# Patient Record
Sex: Male | Born: 1950 | Race: White | Hispanic: No | Marital: Married | Smoking: Former smoker
Health system: Southern US, Community
[De-identification: ages and names within clinical notes are randomized; demographics above are authoritative.]

## PROBLEM LIST (undated history)

## (undated) DIAGNOSIS — I4510 Unspecified right bundle-branch block: Secondary | ICD-10-CM

## (undated) DIAGNOSIS — E785 Hyperlipidemia, unspecified: Secondary | ICD-10-CM

## (undated) DIAGNOSIS — F1010 Alcohol abuse, uncomplicated: Secondary | ICD-10-CM

## (undated) DIAGNOSIS — I471 Supraventricular tachycardia, unspecified: Secondary | ICD-10-CM

## (undated) DIAGNOSIS — Z860100 Personal history of colon polyps, unspecified: Secondary | ICD-10-CM

## (undated) DIAGNOSIS — Z52000 Unspecified donor, whole blood: Secondary | ICD-10-CM

## (undated) DIAGNOSIS — L409 Psoriasis, unspecified: Secondary | ICD-10-CM

## (undated) DIAGNOSIS — I10 Essential (primary) hypertension: Secondary | ICD-10-CM

## (undated) HISTORY — DX: Essential (primary) hypertension: I10

## (undated) HISTORY — DX: Unspecified right bundle-branch block: I45.10

## (undated) HISTORY — DX: Unspecified donor, whole blood: Z52.000

## (undated) HISTORY — DX: Personal history of colonic polyps: Z86.010

## (undated) HISTORY — DX: Personal history of colon polyps, unspecified: Z86.0100

## (undated) HISTORY — DX: Supraventricular tachycardia, unspecified: I47.10

## (undated) HISTORY — DX: Alcohol abuse, uncomplicated: F10.10

## (undated) HISTORY — DX: Hyperlipidemia, unspecified: E78.5

## (undated) HISTORY — DX: Supraventricular tachycardia: I47.1

---
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
## 1970-03-14 HISTORY — PX: OTHER SURGICAL HISTORY: SHX169

## 1997-03-14 HISTORY — PX: KNEE SURGERY: SHX244

## 1998-08-14 ENCOUNTER — Ambulatory Visit (HOSPITAL_COMMUNITY): Admission: RE | Admit: 1998-08-14 | Discharge: 1998-08-14 | Payer: Self-pay | Admitting: Gastroenterology

## 1999-11-09 ENCOUNTER — Ambulatory Visit (HOSPITAL_COMMUNITY): Admission: RE | Admit: 1999-11-09 | Discharge: 1999-11-09 | Payer: Self-pay | Admitting: Specialist

## 1999-11-09 ENCOUNTER — Encounter: Payer: Self-pay | Admitting: Family Medicine

## 2001-11-26 ENCOUNTER — Ambulatory Visit (HOSPITAL_BASED_OUTPATIENT_CLINIC_OR_DEPARTMENT_OTHER): Admission: RE | Admit: 2001-11-26 | Discharge: 2001-11-26 | Payer: Self-pay | Admitting: Specialist

## 2004-01-09 ENCOUNTER — Ambulatory Visit: Payer: Self-pay | Admitting: Pulmonary Disease

## 2004-01-16 ENCOUNTER — Ambulatory Visit: Payer: Self-pay | Admitting: Pulmonary Disease

## 2004-01-22 ENCOUNTER — Ambulatory Visit: Payer: Self-pay | Admitting: Pulmonary Disease

## 2004-02-18 ENCOUNTER — Ambulatory Visit: Payer: Self-pay | Admitting: Pulmonary Disease

## 2004-03-02 ENCOUNTER — Ambulatory Visit: Payer: Self-pay | Admitting: Pulmonary Disease

## 2005-01-14 ENCOUNTER — Ambulatory Visit: Payer: Self-pay | Admitting: Pulmonary Disease

## 2005-01-24 ENCOUNTER — Ambulatory Visit: Payer: Self-pay | Admitting: Pulmonary Disease

## 2005-04-21 ENCOUNTER — Ambulatory Visit: Payer: Self-pay | Admitting: Pulmonary Disease

## 2005-05-09 ENCOUNTER — Ambulatory Visit: Payer: Self-pay | Admitting: Internal Medicine

## 2006-01-13 ENCOUNTER — Ambulatory Visit: Payer: Self-pay | Admitting: Pulmonary Disease

## 2006-01-23 ENCOUNTER — Ambulatory Visit: Payer: Self-pay | Admitting: Pulmonary Disease

## 2006-01-23 LAB — CONVERTED CEMR LAB
Fecal Occult Blood: NEGATIVE
OCCULT 1: NEGATIVE
OCCULT 2: NEGATIVE
OCCULT 3: NEGATIVE
OCCULT 4: NEGATIVE
OCCULT 5: NEGATIVE

## 2007-01-15 ENCOUNTER — Ambulatory Visit: Payer: Self-pay | Admitting: Pulmonary Disease

## 2007-01-15 LAB — CONVERTED CEMR LAB
ALT: 33 units/L (ref 0–53)
AST: 24 units/L (ref 0–37)
Albumin: 4.2 g/dL (ref 3.5–5.2)
Alkaline Phosphatase: 70 units/L (ref 39–117)
BUN: 6 mg/dL (ref 6–23)
Basophils Absolute: 0 10*3/uL (ref 0.0–0.1)
Basophils Relative: 0.6 % (ref 0.0–1.0)
Bilirubin, Direct: 0.1 mg/dL (ref 0.0–0.3)
CO2: 32 meq/L (ref 19–32)
Calcium: 9.2 mg/dL (ref 8.4–10.5)
Chloride: 102 meq/L (ref 96–112)
Cholesterol: 212 mg/dL (ref 0–200)
Creatinine, Ser: 0.8 mg/dL (ref 0.4–1.5)
Direct LDL: 136.6 mg/dL
Eosinophils Absolute: 0.1 10*3/uL (ref 0.0–0.6)
Eosinophils Relative: 1.1 % (ref 0.0–5.0)
GFR calc Af Amer: 129 mL/min
GFR calc non Af Amer: 106 mL/min
Glucose, Bld: 113 mg/dL — ABNORMAL HIGH (ref 70–99)
HCT: 43.2 % (ref 39.0–52.0)
HDL: 50.2 mg/dL (ref >39.0)
Hemoglobin: 15.2 g/dL (ref 13.0–17.0)
Lymphocytes Relative: 14.9 % (ref 12.0–46.0)
MCHC: 35.2 g/dL (ref 30.0–36.0)
MCV: 92.5 fL (ref 78.0–100.0)
Monocytes Absolute: 0.7 10*3/uL (ref 0.2–0.7)
Monocytes Relative: 9.7 % (ref 3.0–11.0)
Neutro Abs: 5.3 10*3/uL (ref 1.4–7.7)
Neutrophils Relative %: 73.7 % (ref 43.0–77.0)
PSA: 0.6 ng/mL (ref 0.10–4.00)
Platelets: 322 10*3/uL (ref 150–400)
Potassium: 4.8 meq/L (ref 3.5–5.1)
RBC: 4.67 M/uL (ref 4.22–5.81)
RDW: 11.4 % — ABNORMAL LOW (ref 11.5–14.6)
Sodium: 141 meq/L (ref 135–145)
TSH: 0.98 microintl units/mL (ref 0.35–5.50)
Total Bilirubin: 1 mg/dL (ref 0.3–1.2)
Total CHOL/HDL Ratio: 4.2
Total Protein: 7.3 g/dL (ref 6.0–8.3)
Triglycerides: 107 mg/dL (ref 0–149)
VLDL: 21 mg/dL (ref 0–40)
WBC: 7.2 10*3/uL (ref 4.5–10.5)

## 2007-01-17 ENCOUNTER — Ambulatory Visit: Payer: Self-pay | Admitting: Cardiology

## 2007-01-29 ENCOUNTER — Encounter: Payer: Self-pay | Admitting: Internal Medicine

## 2007-01-29 ENCOUNTER — Ambulatory Visit: Payer: Self-pay

## 2007-01-30 ENCOUNTER — Ambulatory Visit: Payer: Self-pay | Admitting: Pulmonary Disease

## 2007-01-30 LAB — CONVERTED CEMR LAB
Fecal Occult Blood: NEGATIVE
OCCULT 1: NEGATIVE
OCCULT 2: NEGATIVE
OCCULT 3: NEGATIVE
OCCULT 4: NEGATIVE
OCCULT 5: NEGATIVE

## 2007-01-31 ENCOUNTER — Ambulatory Visit: Payer: Self-pay | Admitting: Cardiology

## 2007-03-26 ENCOUNTER — Ambulatory Visit: Payer: Self-pay | Admitting: Internal Medicine

## 2007-04-11 ENCOUNTER — Ambulatory Visit: Payer: Self-pay | Admitting: Internal Medicine

## 2007-12-17 ENCOUNTER — Emergency Department (HOSPITAL_COMMUNITY): Admission: EM | Admit: 2007-12-17 | Discharge: 2007-12-17 | Payer: Self-pay | Admitting: Emergency Medicine

## 2008-01-03 ENCOUNTER — Ambulatory Visit: Payer: Self-pay | Admitting: Internal Medicine

## 2008-01-14 ENCOUNTER — Encounter: Payer: Self-pay | Admitting: Pulmonary Disease

## 2008-01-14 DIAGNOSIS — I10 Essential (primary) hypertension: Secondary | ICD-10-CM

## 2008-01-14 DIAGNOSIS — I4510 Unspecified right bundle-branch block: Secondary | ICD-10-CM

## 2008-01-14 DIAGNOSIS — F1010 Alcohol abuse, uncomplicated: Secondary | ICD-10-CM | POA: Insufficient documentation

## 2008-01-14 DIAGNOSIS — E782 Mixed hyperlipidemia: Secondary | ICD-10-CM | POA: Insufficient documentation

## 2008-01-15 ENCOUNTER — Ambulatory Visit: Payer: Self-pay | Admitting: Pulmonary Disease

## 2008-01-15 DIAGNOSIS — K649 Unspecified hemorrhoids: Secondary | ICD-10-CM | POA: Insufficient documentation

## 2008-01-21 LAB — CONVERTED CEMR LAB
ALT: 29 units/L (ref 0–53)
AST: 28 units/L (ref 0–37)
Albumin: 4.5 g/dL (ref 3.5–5.2)
Alkaline Phosphatase: 64 units/L (ref 39–117)
BUN: 9 mg/dL (ref 6–23)
Bacteria, UA: NEGATIVE
Basophils Absolute: 0 10*3/uL (ref 0.0–0.1)
Basophils Relative: 0.5 % (ref 0.0–3.0)
Bilirubin Urine: NEGATIVE
Bilirubin, Direct: 0.2 mg/dL (ref 0.0–0.3)
CO2: 32 meq/L (ref 19–32)
Calcium: 9.2 mg/dL (ref 8.4–10.5)
Chloride: 101 meq/L (ref 96–112)
Cholesterol: 171 mg/dL (ref 0–200)
Creatinine, Ser: 0.7 mg/dL (ref 0.4–1.5)
Crystals: NEGATIVE
Eosinophils Absolute: 0.1 10*3/uL (ref 0.0–0.7)
Eosinophils Relative: 1.8 % (ref 0.0–5.0)
GFR calc Af Amer: 149 mL/min
GFR calc non Af Amer: 124 mL/min
Glucose, Bld: 94 mg/dL (ref 70–99)
HCT: 42.1 % (ref 39.0–52.0)
HDL: 60.6 mg/dL (ref >39.0)
Hemoglobin, Urine: NEGATIVE
Hemoglobin: 15.1 g/dL (ref 13.0–17.0)
Ketones, ur: NEGATIVE mg/dL
LDL Cholesterol: 86 mg/dL (ref 0–99)
Leukocytes, UA: NEGATIVE
Lymphocytes Relative: 19.3 % (ref 12.0–46.0)
MCHC: 35.9 g/dL (ref 30.0–36.0)
MCV: 92.1 fL (ref 78.0–100.0)
Monocytes Absolute: 0.6 10*3/uL (ref 0.1–1.0)
Monocytes Relative: 11.4 % (ref 3.0–12.0)
Neutro Abs: 3.9 10*3/uL (ref 1.4–7.7)
Neutrophils Relative %: 67 % (ref 43.0–77.0)
Nitrite: NEGATIVE
PSA: 0.59 ng/mL (ref 0.10–4.00)
Platelets: 259 10*3/uL (ref 150–400)
Potassium: 4.2 meq/L (ref 3.5–5.1)
RBC: 4.58 M/uL (ref 4.22–5.81)
RDW: 12.2 % (ref 11.5–14.6)
Sodium: 142 meq/L (ref 135–145)
Specific Gravity, Urine: 1.02 (ref 1.000–1.03)
Squamous Epithelial / HPF: NEGATIVE /lpf
TSH: 1.71 microintl units/mL (ref 0.35–5.50)
Total Bilirubin: 1.2 mg/dL (ref 0.3–1.2)
Total CHOL/HDL Ratio: 2.8
Total Protein, Urine: NEGATIVE mg/dL
Total Protein: 7.6 g/dL (ref 6.0–8.3)
Triglycerides: 121 mg/dL (ref 0–149)
Urine Glucose: NEGATIVE mg/dL
Urobilinogen, UA: 0.2 (ref 0.0–1.0)
VLDL: 24 mg/dL (ref 0–40)
WBC, UA: NONE SEEN cells/hpf
WBC: 5.7 10*3/uL (ref 4.5–10.5)
pH: 6.5 (ref 5.0–8.0)

## 2008-01-29 ENCOUNTER — Ambulatory Visit: Payer: Self-pay | Admitting: Pulmonary Disease

## 2008-02-14 LAB — CONVERTED CEMR LAB
Fecal Occult Blood: NEGATIVE
OCCULT 1: NEGATIVE
OCCULT 2: NEGATIVE
OCCULT 3: NEGATIVE
OCCULT 4: NEGATIVE
OCCULT 5: NEGATIVE

## 2008-06-21 DIAGNOSIS — I471 Supraventricular tachycardia: Secondary | ICD-10-CM

## 2008-06-24 ENCOUNTER — Ambulatory Visit: Payer: Self-pay | Admitting: Internal Medicine

## 2008-06-24 ENCOUNTER — Encounter: Payer: Self-pay | Admitting: Internal Medicine

## 2008-08-05 ENCOUNTER — Telehealth: Payer: Self-pay | Admitting: Internal Medicine

## 2008-12-23 ENCOUNTER — Ambulatory Visit: Payer: Self-pay | Admitting: Internal Medicine

## 2009-01-14 ENCOUNTER — Ambulatory Visit: Payer: Self-pay | Admitting: Pulmonary Disease

## 2009-01-15 LAB — CONVERTED CEMR LAB
AST: 27 units/L (ref 0–37)
BUN: 10 mg/dL (ref 6–23)
Basophils Absolute: 0 10*3/uL (ref 0.0–0.1)
Bilirubin, Direct: 0.2 mg/dL (ref 0.0–0.3)
Cholesterol: 168 mg/dL (ref 0–200)
Creatinine, Ser: 0.9 mg/dL (ref 0.4–1.5)
GFR calc non Af Amer: 91.97 mL/min (ref >60)
Glucose, Bld: 93 mg/dL (ref 70–99)
HCT: 42.8 % (ref 39.0–52.0)
HDL: 49.4 mg/dL (ref >39.00)
LDL Cholesterol: 95 mg/dL (ref 0–99)
Leukocytes, UA: NEGATIVE
Lymphs Abs: 1.1 10*3/uL (ref 0.7–4.0)
Monocytes Absolute: 0.6 10*3/uL (ref 0.1–1.0)
Monocytes Relative: 11.1 % (ref 3.0–12.0)
Neutrophils Relative %: 66.8 % (ref 43.0–77.0)
PSA: 0.61 ng/mL (ref 0.10–4.00)
Platelets: 247 10*3/uL (ref 150.0–400.0)
Potassium: 4.3 meq/L (ref 3.5–5.1)
RDW: 11.8 % (ref 11.5–14.6)
Specific Gravity, Urine: 1.02 (ref 1.000–1.030)
TSH: 1.07 microintl units/mL (ref 0.35–5.50)
Total Bilirubin: 1.4 mg/dL — ABNORMAL HIGH (ref 0.3–1.2)
Triglycerides: 117 mg/dL (ref 0.0–149.0)
Urobilinogen, UA: 0.2 (ref 0.0–1.0)
VLDL: 23.4 mg/dL (ref 0.0–40.0)
WBC: 5.7 10*3/uL (ref 4.5–10.5)
pH: 6 (ref 5.0–8.0)

## 2009-01-23 IMAGING — CR DG CHEST 2V
2 series · 2 of 2 positions shown · non-contrast
Comparison: 01/13/06.

CLINICAL DATA: Wellness.
 CHEST - 2 VIEWS:

[view not recorded (1 of 2)]
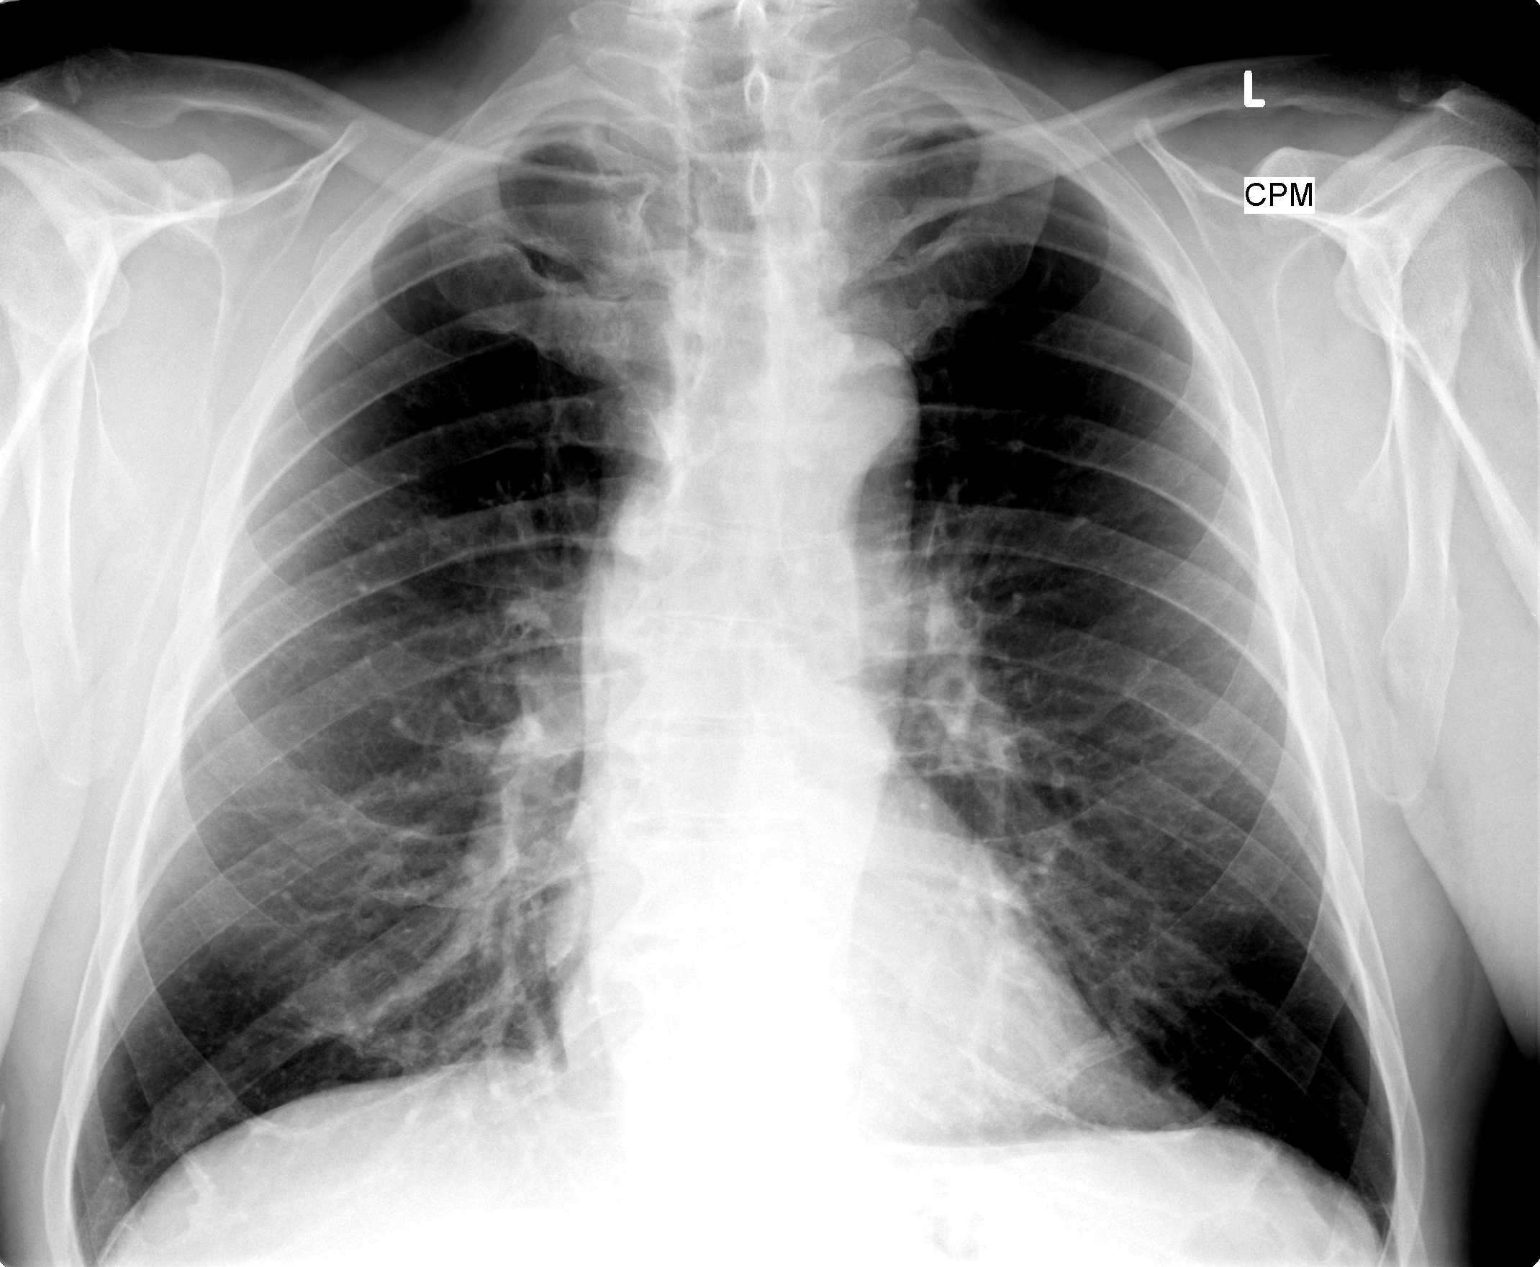

[view not recorded (2 of 2)]
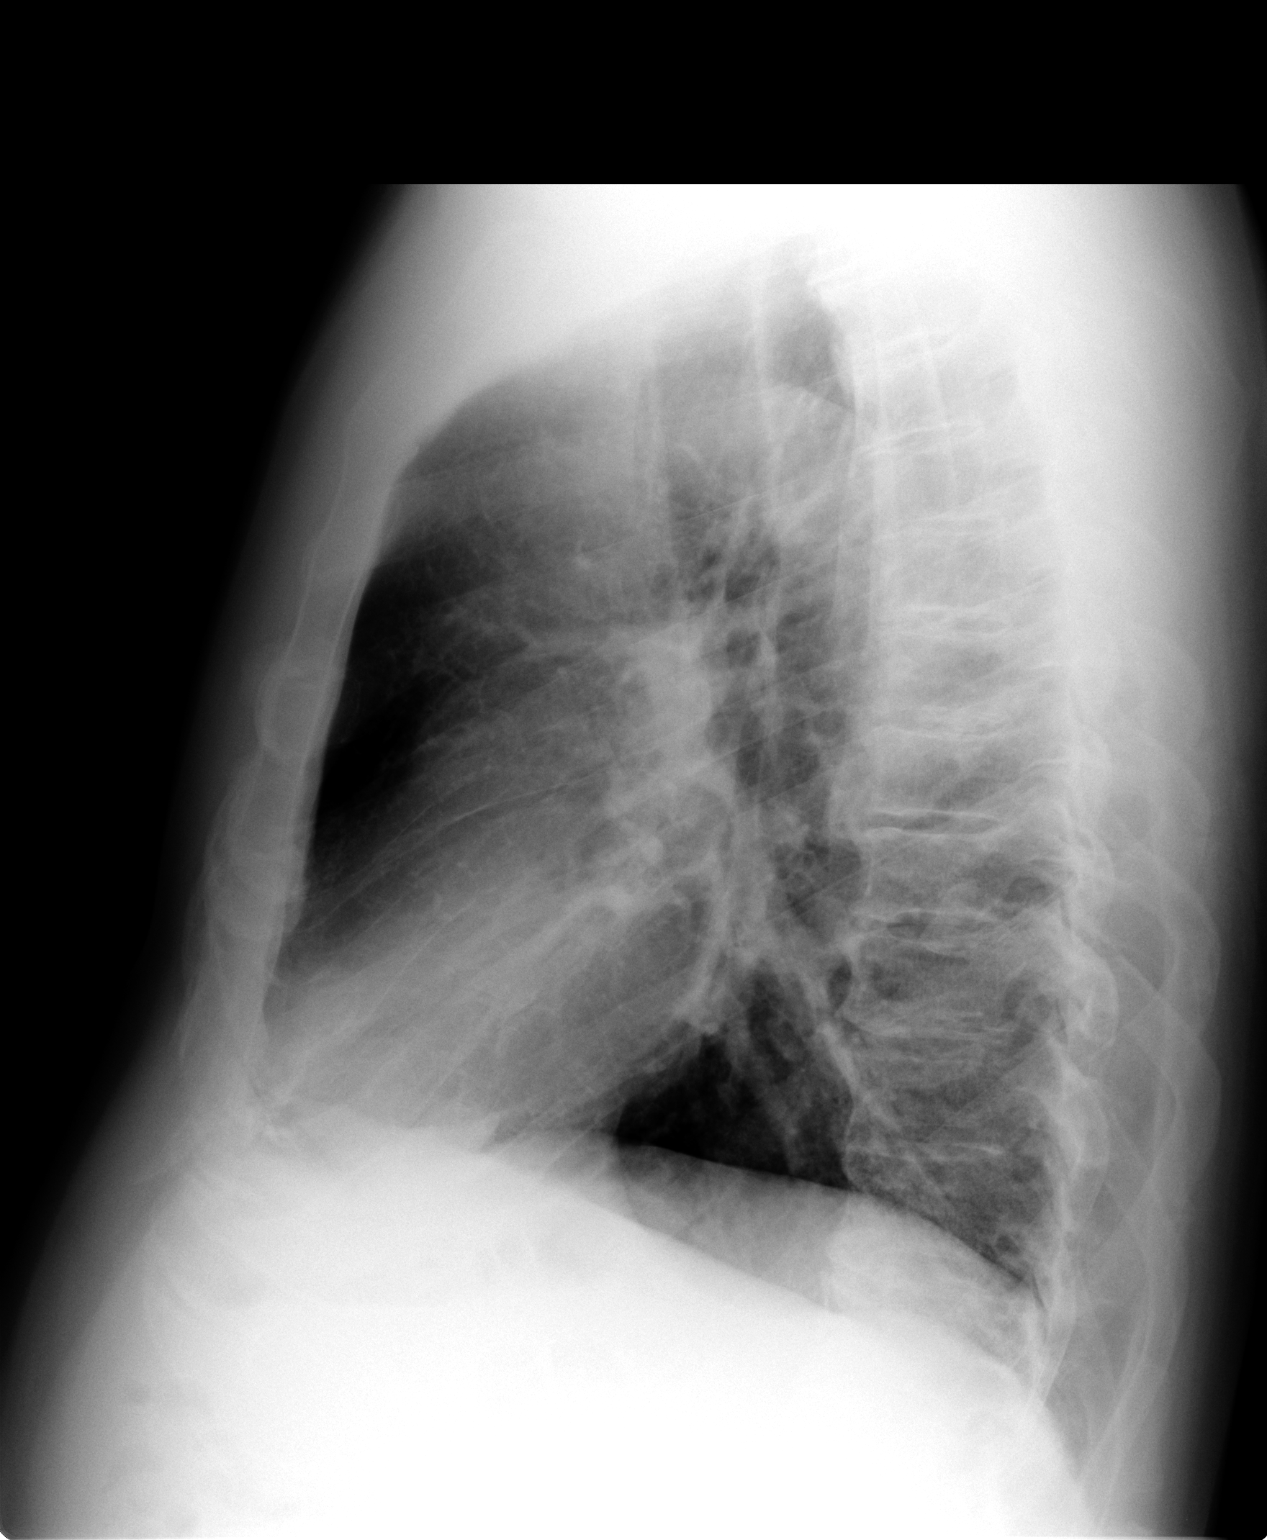

[2 of 2 positions shown; findings below may reference images not displayed]

FINDINGS: Two views of the chest show the lungs to be clear and slightly hyperaerated.  The heart is within normal limits in size.  There are degenerative changes throughout the thoracic spine.
IMPRESSION: Stable chest x-ray.  No active lung disease.

## 2009-02-02 ENCOUNTER — Encounter: Payer: Self-pay | Admitting: Pulmonary Disease

## 2009-05-12 ENCOUNTER — Telehealth (INDEPENDENT_AMBULATORY_CARE_PROVIDER_SITE_OTHER): Payer: Self-pay | Admitting: *Deleted

## 2009-06-23 ENCOUNTER — Telehealth (INDEPENDENT_AMBULATORY_CARE_PROVIDER_SITE_OTHER): Payer: Self-pay | Admitting: *Deleted

## 2009-07-13 ENCOUNTER — Telehealth: Payer: Self-pay | Admitting: Pulmonary Disease

## 2009-08-03 ENCOUNTER — Telehealth: Payer: Self-pay | Admitting: Internal Medicine

## 2009-11-26 ENCOUNTER — Telehealth (INDEPENDENT_AMBULATORY_CARE_PROVIDER_SITE_OTHER): Payer: Self-pay | Admitting: *Deleted

## 2009-12-01 ENCOUNTER — Telehealth: Payer: Self-pay | Admitting: Pulmonary Disease

## 2009-12-22 ENCOUNTER — Ambulatory Visit: Payer: Self-pay | Admitting: Internal Medicine

## 2010-01-18 ENCOUNTER — Encounter: Payer: Self-pay | Admitting: Pulmonary Disease

## 2010-01-18 ENCOUNTER — Ambulatory Visit: Payer: Self-pay | Admitting: Pulmonary Disease

## 2010-01-21 LAB — CONVERTED CEMR LAB
ALT: 27 units/L (ref 0–53)
Albumin: 4.3 g/dL (ref 3.5–5.2)
BUN: 12 mg/dL (ref 6–23)
Basophils Relative: 0.3 % (ref 0.0–3.0)
Bilirubin, Direct: 0.1 mg/dL (ref 0.0–0.3)
CO2: 30 meq/L (ref 19–32)
Chloride: 101 meq/L (ref 96–112)
Creatinine, Ser: 0.8 mg/dL (ref 0.4–1.5)
Eosinophils Absolute: 0.2 10*3/uL (ref 0.0–0.7)
Glucose, Bld: 99 mg/dL (ref 70–99)
HDL: 53.5 mg/dL (ref >39.00)
Hemoglobin, Urine: NEGATIVE
Lymphocytes Relative: 18.4 % (ref 12.0–46.0)
MCHC: 34.3 g/dL (ref 30.0–36.0)
Neutrophils Relative %: 66.3 % (ref 43.0–77.0)
RBC: 4.52 M/uL (ref 4.22–5.81)
Specific Gravity, Urine: 1.025 (ref 1.000–1.030)
Total CHOL/HDL Ratio: 3
Total Protein, Urine: NEGATIVE mg/dL
Total Protein: 7.1 g/dL (ref 6.0–8.3)
Triglycerides: 126 mg/dL (ref 0.0–149.0)
Urine Glucose: NEGATIVE mg/dL
WBC: 6.6 10*3/uL (ref 4.5–10.5)

## 2010-01-31 DIAGNOSIS — D126 Benign neoplasm of colon, unspecified: Secondary | ICD-10-CM

## 2010-02-23 ENCOUNTER — Telehealth (INDEPENDENT_AMBULATORY_CARE_PROVIDER_SITE_OTHER): Payer: Self-pay | Admitting: *Deleted

## 2010-04-13 NOTE — Progress Notes (Signed)
Summary: Simvastatin rx   Phone Note Call from Patient   Caller: Patient Call For: nadel Summary of Call: [facility] says [facility] him to call here re: refill for SIMVISTATIN. PT REQUESTS 90 DAYS SUPPLY 40MG  TABS. [facility] # [phone]. PT # [id number] Initial call taken by: Tivis Ringer, CNA,  November 26, 2009 12:34 PM  Follow-up for Phone Call        Simvastatin rx faxed to [facility].  LMOMTCB to inform pt of this and remind him to keep schedule ov in November for additional rxs.  Gweneth Dimitri RN  November 26, 2009 2:26 PM  Pt returned call.  He was informed of above and verbalized understanding.    Follow-up by: Gweneth Dimitri RN,  November 26, 2009 2:29 PM    Prescriptions: SIMVASTATIN 40 MG TABS (SIMVASTATIN) take as directed...  #90 x 0   Entered by:   Gweneth Dimitri RN   Authorized by:   Michele Mcalpine MD   Signed by:   Gweneth Dimitri RN on 11/26/2009   Method used:   Faxed to ...       [facility]) (mail-order)             ,          Ph: [phone]       Fax: [phone]   RxID:   [id number]

## 2010-04-13 NOTE — Progress Notes (Signed)
Summary: resend simvastatin rx  Phone Note Call from Patient Call back at [phone]   Caller: Patient Summary of Call: Pt states that rx that was faxed on 11-26-09 for simvastatin was not received by [facility]. Pt requesting refill be resent. he states [facility] said to not send it electronically, because they were having trouble with their system receiving electronic rx.  Initial call taken by: Carron Curie CMA,  December 01, 2009 9:26 AM  Follow-up for Phone Call        printed rx and gave to Leigh to have SN sign.  Aundra Millet Reynolds LPN  December 01, 2009 9:31 AM    rx has been signed and [facility]ed to [facility] Randell Loop CMA  December 01, 2009 10:00 AM     Prescriptions: SIMVASTATIN 40 MG TABS (SIMVASTATIN) take as directed...  #90 x 0   Entered by:   Arman Filter LPN   Authorized by:   Michele Mcalpine MD   Signed by:   Arman Filter LPN on 08/65/7846   Method used:   Printed then faxed to ...       [facility]  [id number]       [address]       [place], [place]  [address]       Ph: [phone] or [phone]       Fax: [phone]   RxID:   [id number]

## 2010-04-13 NOTE — Progress Notes (Signed)
Summary: [facility]**  Phone Note Refill Request Message from:  Patient on Aug 03, 2009 12:15 PM  Refills Requested: Medication #1:  PROPRANOLOL HCL CR 60 MG XR24H-CAP Take 1 capsule by mouth once a day   Supply Requested: 3 months [facility] [phone]   Method Requested: Fax to [facility] Pharm[facility] Initial call taken by: Migdalia Dk,  Aug 03, 2009 12:16 PM    Prescriptions: PROPRANOLOL HCL CR 60 MG XR24H-CAP (PROPRANOLOL HCL) Take 1 capsule by mouth once a day  #90 x 3   Entered by:   Judithe Modest CMA   Authorized by:   Nathen May, MD, [facility]   Signed by:   Judithe Modest CMA on 08/03/2009   Method used:   [facility]ronically to        [facility]  [address] [id number]       [address]       [place], [place]  [address]       Ph: [phone] or [phone]       Fax: [phone]   RxID:   [id number]

## 2010-04-13 NOTE — Progress Notes (Signed)
Summary: speak to nurse  Phone Note Call from Patient Call back at [phone]   Caller: Patient Reason for Call: Talk to Nurse Summary of Call: runny nose,cough, wants zpack called in. Initial call taken by: Darletta Moll,  May 12, 2009 9:33 AM  Follow-up for Phone Call        per sn ok for z-pak Follow-up by: Philipp Deputy CMA,  May 12, 2009 9:39 AM    New/Updated Medications: ZITHROMAX Z-PAK 250 MG TABS (AZITHROMYCIN) take as directed Prescriptions: ZITHROMAX Z-PAK 250 MG TABS (AZITHROMYCIN) take as directed  #1 x 0   Entered by:   Philipp Deputy CMA   Authorized by:   Michele Mcalpine MD   Signed by:   Philipp Deputy CMA on 05/12/2009   Method used:   [facility] to        [facility]  [address] [id number]       [address]       [place], [place]  [address]       Ph: [phone] or [phone]       Fax: [phone]   RxID:   [id number]

## 2010-04-13 NOTE — Progress Notes (Signed)
  [employer] faxed LOv,12,Echo over to [facility] [phone] [facility]  June 23, 2009 12:38 PM
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
## 2010-04-13 NOTE — Progress Notes (Signed)
Summary: pt sick and wants rx-  Phone Note Call from Patient Call back at Home Phone [phone]   Caller: Patient Call For: Cliffie Gingras Summary of Call: patient has sore throat and nose running, its going down in his chest. he dont want to come in to see the dr he would like something called in.  [facility] [address] Initial call taken by: Valinda Hoar,  Jul 13, 2009 12:23 PM  Follow-up for Phone Call        pt c/o productive cough with yellow phlegm, nasal drainage, sore throat x 3 days. Pt request zpak. please advise.Carron Curie CMA  Jul 13, 2009 12:50 PM allergies: NKDA  Additional Follow-up for Phone Call Additional follow up Details #1::        per SN---ok for pt to have zpak  #1  take as directed with no refills. Randell Loop CMA  Jul 13, 2009 4:38 PM     Additional Follow-up for Phone Call Additional follow up Details #2::    LMOMTCB Vernie Murders  Jul 13, 2009 4:40 PM  pt advised. Carron Curie CMA  Jul 13, 2009 4:57 PM   Prescriptions: ZITHROMAX Z-PAK 250 MG TABS (AZITHROMYCIN) take as directed  #1 x 0   Entered by:   Carron Curie CMA   Authorized by:   Michele Mcalpine MD   Signed by:   Carron Curie CMA on 07/13/2009   Method used:   [facility]  [address] [id number]       [address]       [place], [place]  [address]       Ph: [phone] or [phone]       Fax: [phone]   RxID:   [id number]

## 2010-04-15 NOTE — Progress Notes (Signed)
Summary: labwork results  Phone Note Call from Patient Call back at Home Phone [phone]   Caller: Patient Call For: NADEL Reason for Call: Talk to Nurse, Lab or Test Results Summary of Call: Patient asking for results of labwork Initial call taken by: Lehman Prom,  February 23, 2010 4:42 PM  Follow-up for Phone Call        Spoke with pt and gave lab results- he needed the numbers of lipid panel for ins information.   Follow-up by: Vernie Murders,  February 23, 2010 4:53 PM

## 2010-07-27 NOTE — Assessment & Plan Note (Signed)
[facility]                         ELECTROPHYSIOLOGY OFFICE NOTE   NAME:[name], Brian SERPA                     MRN:          [id number]  DATE:01/03/2008                            DOB:          11-27-1950    [name] was seen in followup for recurrent episode of  supraventricular tachycardia that required adenosine.  It had started  like many others; however, it did not terminate on his own.  It was  associated with lightheadedness and no chest pain or shortness of  breath.  He ended up with the hospital where he received adenosine.   Electrocardiogram is notable for an R prime in lead V1 and quite  distinct from pseudo R prime and no other lead could I identify the  retrograde P-waves.   He had tried at that episode prior to going to the hospital Valsalva as  well as Lopressor up to 75 mg, without success.   On examination, his blood pressure was 130/84, his pulse was 90, and his  weight was 222, which is stable.  His lungs were clear.  His hearts  sounds were regular.  The extremities were without edema.   IMPRESSION:  Supraventricular tachycardia.   We will plan to see [name] again in four months' time to see how  he is doing.  He has sworn off coffee and chocolate, but I am not all  that sanguine that this will prevent him from recurrences.     Duke Salvia, MD, [facility]  Electronically Signed    SCK/MedQ  DD: 01/03/2008  DT: 01/04/2008  Job #: [id number]
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
## 2010-07-27 NOTE — Assessment & Plan Note (Signed)
[facility]                         ELECTROPHYSIOLOGY OFFICE NOTE   NAME:[name], [name]                     MRN:          [id number]  DATE:04/11/2007                            DOB:          [birth year]    [name] is seen following recurrences of his supraventricular  tachycardia which he was able to get documented in late October.  This  demonstrated narrow QRS tachycardia at a cycle length of about 380  milliseconds.  There was an R prime in lead V1, a rapid transition,  which was old, a P-wave could not clearly be discerned except  potentially by the obliteration of a small RS in lead II.   He was put on Lopressor which he is taking 25 p.r.n. and actually  somebody told him to stick his face in cold water with an episode of  tachycardia, which I advised him not to do, as this has been thought to  be contraindicated in adults presumably because of the potential for  coronary artery vasospasm.   In any case, the patient would like to continue on his current course.  We will see me in again one year's time.     Duke Salvia, MD, [facility]  Electronically Signed    SCK/MedQ  DD: 04/11/2007  DT: 04/12/2007  Job #: [id number]
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
## 2010-07-30 NOTE — Op Note (Signed)
   NAME:  [name], [name]                        ACCOUNT NO.:  [id number]   MEDICAL RECORD NO.:  [id number]                   PATIENT TYPE:  AMB   LOCATION:  [facility]SC                                  FACILITY:  [facility]   PHYSICIAN:  Philips J. Montez Morita, M.D.             DATE OF BIRTH:  04-25-1950   DATE OF PROCEDURE:  11/26/2001  DATE OF DISCHARGE:                                 OPERATIVE REPORT   PREOPERATIVE DIAGNOSIS:  Left pump bump with some Achilles tendinitis.   POSTOPERATIVE DIAGNOSIS:  Left pump bump with some Achilles tendinitis plus  calcaneal spur.   OPERATIVE PROCEDURE:  Removal of the pump bump and the spur.   SURGEON:  Philips J. Montez Morita, M.D.   PROCEDURE:  With considerable general anesthesia the upper thigh tourniquet  was applied with the patient in the supine position and the leg was  exsanguinated and inflated to 375 mmHg.  He was then placed prone and  prepped and draped routinely.  An incision was made on the lateral side of  the Achilles tendon and transversely distal to its insertion.  A flap of  tissue was raised.  The Achilles was inspected from the lateral side and an  opening was made from the lateral surface to expose the prominence of the  beak of the back of the calcaneous.  This was resected with an osteotome and  using a bur to go across protecting the medial side with a Crego elevator  until that was nice and smooth and no pressure from that.  I then made a  central incision through the tendon taking off part of its insertion but not  all, removing the prominence of the calcaneal spur and then suturing that  with some 0 coated Vicryl and the repair which was also used on the lateral  side of the Achilles as well.  The tourniquet was let down and hemostasis  secured as this closure was going on with some 2-0 in the subcutaneous and a  running Monocryl in the skin with Steri-Strips.  A short leg fiberglass was  applied with the foot in ricinus.  A total of  20 cc of 1/2% Marcaine was  injected throughout the area for good pain relief.  He was taken to recovery  in good condition.                                               Philips J. Montez Morita, M.D.    PJC/MEDQ  D:  11/26/2001  T:  11/26/2001  Job:  [id number]

## 2010-09-21 ENCOUNTER — Other Ambulatory Visit: Payer: Self-pay | Admitting: *Deleted

## 2010-09-21 MED ORDER — SILDENAFIL CITRATE 100 MG PO TABS: 100.0000 mg | ORAL_TABLET | ORAL | Status: DC | PRN

## 2010-12-13 LAB — POCT I-STAT, CHEM 8
Calcium, Ion: 1.05 — ABNORMAL LOW
Creatinine, Ser: 1
Glucose, Bld: 106 — ABNORMAL HIGH
Hemoglobin: 15.6
Sodium: 137
TCO2: 25

## 2010-12-30 ENCOUNTER — Encounter: Payer: Self-pay | Admitting: Internal Medicine

## 2011-01-05 ENCOUNTER — Ambulatory Visit (INDEPENDENT_AMBULATORY_CARE_PROVIDER_SITE_OTHER): Payer: BC Managed Care – PPO | Admitting: Internal Medicine

## 2011-01-05 ENCOUNTER — Encounter: Payer: Self-pay | Admitting: Internal Medicine

## 2011-01-05 DIAGNOSIS — R9431 Abnormal electrocardiogram [ECG] [EKG]: Secondary | ICD-10-CM

## 2011-01-05 DIAGNOSIS — I471 Supraventricular tachycardia: Secondary | ICD-10-CM

## 2011-01-05 NOTE — Assessment & Plan Note (Signed)
He would like to continue on his p.r.n. protocol
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
## 2011-01-05 NOTE — Patient Instructions (Signed)
Your physician wants you to follow-up in: 2 years with Dr. Klein. You will receive a reminder letter in the mail two months in advance. If you don't receive a letter, please call our office to schedule the follow-up appointment.  Your physician recommends that you continue on your current medications as directed. Please refer to the Current Medication list given to you today.  

## 2011-01-05 NOTE — Assessment & Plan Note (Signed)
In the absence of symptoms, we will continue to observe

## 2011-01-20 ENCOUNTER — Ambulatory Visit (INDEPENDENT_AMBULATORY_CARE_PROVIDER_SITE_OTHER)
Admission: RE | Admit: 2011-01-20 | Discharge: 2011-01-20 | Disposition: A | Discharge status: Home / Self Care | Payer: BC Managed Care – PPO | Source: Ambulatory Visit | Attending: Pulmonary Disease | Admitting: Pulmonary Disease

## 2011-01-20 ENCOUNTER — Encounter: Payer: Self-pay | Admitting: Pulmonary Disease

## 2011-01-20 ENCOUNTER — Other Ambulatory Visit (INDEPENDENT_AMBULATORY_CARE_PROVIDER_SITE_OTHER): Payer: BC Managed Care – PPO

## 2011-01-20 ENCOUNTER — Ambulatory Visit (INDEPENDENT_AMBULATORY_CARE_PROVIDER_SITE_OTHER): Payer: BC Managed Care – PPO | Admitting: Pulmonary Disease

## 2011-01-20 VITALS — BP 120/72 | HR 82 | Temp 97.9°F | Ht 72.0 in | Wt 228.0 lb

## 2011-01-20 DIAGNOSIS — E785 Hyperlipidemia, unspecified: Secondary | ICD-10-CM

## 2011-01-20 DIAGNOSIS — Z0000 Encounter for general adult medical examination without abnormal findings: Secondary | ICD-10-CM

## 2011-01-20 DIAGNOSIS — I471 Supraventricular tachycardia: Secondary | ICD-10-CM

## 2011-01-20 DIAGNOSIS — D126 Benign neoplasm of colon, unspecified: Secondary | ICD-10-CM

## 2011-01-20 DIAGNOSIS — I10 Essential (primary) hypertension: Secondary | ICD-10-CM

## 2011-01-20 DIAGNOSIS — I4510 Unspecified right bundle-branch block: Secondary | ICD-10-CM

## 2011-01-20 LAB — CBC WITH DIFFERENTIAL/PLATELET
Basophils Absolute: 0 10*3/uL (ref 0.0–0.1)
Basophils Relative: 0.3 % (ref 0.0–3.0)
HCT: 42 % (ref 39.0–52.0)
Hemoglobin: 14.3 g/dL (ref 13.0–17.0)
Lymphs Abs: 1.1 10*3/uL (ref 0.7–4.0)
MCHC: 34.1 g/dL (ref 30.0–36.0)
Monocytes Relative: 12.1 % — ABNORMAL HIGH (ref 3.0–12.0)
Neutro Abs: 4.3 10*3/uL (ref 1.4–7.7)
RBC: 4.49 Mil/uL (ref 4.22–5.81)
RDW: 12.7 % (ref 11.5–14.6)

## 2011-01-20 LAB — URINALYSIS
Ketones, ur: NEGATIVE
Specific Gravity, Urine: 1.025 (ref 1.000–1.030)
Total Protein, Urine: NEGATIVE
Urine Glucose: NEGATIVE
Urobilinogen, UA: 0.2 (ref 0.0–1.0)

## 2011-01-20 LAB — BASIC METABOLIC PANEL
Calcium: 9.2 mg/dL (ref 8.4–10.5)
Creatinine, Ser: 0.8 mg/dL (ref 0.4–1.5)
GFR: 107.74 mL/min (ref >60.00)
Sodium: 143 mEq/L (ref 135–145)

## 2011-01-20 LAB — HEPATIC FUNCTION PANEL
Albumin: 4.4 g/dL (ref 3.5–5.2)
Alkaline Phosphatase: 63 U/L (ref 39–117)
Bilirubin, Direct: 0.1 mg/dL (ref 0.0–0.3)

## 2011-01-20 LAB — LIPID PANEL
HDL: 55.3 mg/dL (ref >39.00)
LDL Cholesterol: 81 mg/dL (ref 0–99)
Total CHOL/HDL Ratio: 3
VLDL: 25.6 mg/dL (ref 0.0–40.0)

## 2011-01-20 LAB — PSA: PSA: 0.58 ng/mL (ref 0.10–4.00)

## 2011-01-20 MED ORDER — SIMVASTATIN 40 MG PO TABS: 40.0000 mg | ORAL_TABLET | Freq: Every day | ORAL | Status: DC

## 2011-01-20 MED ORDER — PROPRANOLOL HCL 60 MG PO TABS: 60.0000 mg | ORAL_TABLET | Freq: Every day | ORAL | Status: DC

## 2011-01-20 MED ORDER — SILDENAFIL CITRATE 100 MG PO TABS: 100.0000 mg | ORAL_TABLET | ORAL | Status: DC | PRN

## 2011-01-20 NOTE — Patient Instructions (Signed)
Today we updated your med list in our EPIC system...    Continue your current medications the same...    We refilled your meds per request...  Today we did your follow up CXR & fasting blood work...    Please call the PHONE TREE in a few days for your results...    Dial [id number] & when prompted enter your patient number followed by the # symbol...    Your patient number is:  [id number]  Call for any questions...  Let's plan another physical in 1 year's time, call sooner as needed for problems.Marland KitchenMarland Kitchen

## 2011-01-21 ENCOUNTER — Encounter: Payer: Self-pay | Admitting: Pulmonary Disease
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
## 2011-01-27 ENCOUNTER — Telehealth: Payer: Self-pay | Admitting: Pulmonary Disease

## 2011-01-27 NOTE — Telephone Encounter (Signed)
Propranolol ER 60 mg capsules is what has been filled for this patient in the past. Per SN OV note from 01/20/11 this is what should have been sent to the pharmacy. I gave a verbal order for this over the phone to the pharmacist, Viviann Spare. Medication list has been updated.

## 2011-06-16 ENCOUNTER — Telehealth: Payer: Self-pay | Admitting: Internal Medicine

## 2011-06-16 NOTE — Telephone Encounter (Signed)
Called pt to verify what medication he needed refilled pt states its a white pill..he states Dr. Graciela Husbands gave him a prescription for it to reduce his heart rate. I will look further back to see what medication pt is referring to. I asked pt was it propanolol or Simvastatin since these where the meds on his list he states no those meds dos'nt need refilling its another medication. I told pt to call us back if he remembers what medication needs filling and we will also look back to past dicataions

## 2011-06-16 NOTE — Telephone Encounter (Signed)
Please return call to patient at [phone] who needs a refill but could not tell me anything about the medication to expedite request.

## 2011-06-16 NOTE — Telephone Encounter (Signed)
Looks as if in 01/17/2007 pt was placed on Metoprolol I have given him a prescription for Lopressor 25 mg to take on a     p.r.n. basis for these tachy palpitations.Jacolyn Reedy, PA-C  Pt has not had this refilled in a couple years will send this to nurse to verify if its ok to start pt back on this med prn.Marland Kitchen

## 2011-12-20 HISTORY — PX: OTHER SURGICAL HISTORY: SHX169

## 2012-01-19 ENCOUNTER — Encounter: Payer: Self-pay | Admitting: *Deleted

## 2012-01-20 ENCOUNTER — Ambulatory Visit (INDEPENDENT_AMBULATORY_CARE_PROVIDER_SITE_OTHER)
Admission: RE | Admit: 2012-01-20 | Discharge: 2012-01-20 | Disposition: A | Discharge status: Home / Self Care | Payer: BC Managed Care – PPO | Source: Ambulatory Visit | Attending: Pulmonary Disease | Admitting: Pulmonary Disease

## 2012-01-20 ENCOUNTER — Encounter: Payer: Self-pay | Admitting: Pulmonary Disease

## 2012-01-20 ENCOUNTER — Ambulatory Visit (INDEPENDENT_AMBULATORY_CARE_PROVIDER_SITE_OTHER): Payer: BC Managed Care – PPO | Admitting: Pulmonary Disease

## 2012-01-20 ENCOUNTER — Other Ambulatory Visit (INDEPENDENT_AMBULATORY_CARE_PROVIDER_SITE_OTHER): Payer: BC Managed Care – PPO

## 2012-01-20 VITALS — BP 126/78 | HR 62 | Temp 97.8°F | Ht 72.0 in | Wt 232.6 lb

## 2012-01-20 DIAGNOSIS — Z0000 Encounter for general adult medical examination without abnormal findings: Secondary | ICD-10-CM

## 2012-01-20 DIAGNOSIS — Z23 Encounter for immunization: Secondary | ICD-10-CM

## 2012-01-20 DIAGNOSIS — I10 Essential (primary) hypertension: Secondary | ICD-10-CM

## 2012-01-20 DIAGNOSIS — D126 Benign neoplasm of colon, unspecified: Secondary | ICD-10-CM

## 2012-01-20 DIAGNOSIS — R9431 Abnormal electrocardiogram [ECG] [EKG]: Secondary | ICD-10-CM

## 2012-01-20 DIAGNOSIS — I471 Supraventricular tachycardia: Secondary | ICD-10-CM

## 2012-01-20 DIAGNOSIS — E785 Hyperlipidemia, unspecified: Secondary | ICD-10-CM

## 2012-01-20 LAB — BASIC METABOLIC PANEL
BUN: 12 mg/dL (ref 6–23)
Calcium: 9.5 mg/dL (ref 8.4–10.5)
Chloride: 101 mEq/L (ref 96–112)
Creatinine, Ser: 0.9 mg/dL (ref 0.4–1.5)
GFR: 95.94 mL/min (ref >60.00)

## 2012-01-20 LAB — CBC WITH DIFFERENTIAL/PLATELET
Basophils Relative: 0.3 % (ref 0.0–3.0)
Eosinophils Absolute: 0.2 10*3/uL (ref 0.0–0.7)
Eosinophils Relative: 2.4 % (ref 0.0–5.0)
HCT: 44.8 % (ref 39.0–52.0)
Hemoglobin: 14.9 g/dL (ref 13.0–17.0)
MCHC: 33.3 g/dL (ref 30.0–36.0)
MCV: 93 fl (ref 78.0–100.0)
Monocytes Absolute: 0.9 10*3/uL (ref 0.1–1.0)
Neutro Abs: 4.3 10*3/uL (ref 1.4–7.7)
Neutrophils Relative %: 66.6 % (ref 43.0–77.0)
RBC: 4.82 Mil/uL (ref 4.22–5.81)
WBC: 6.5 10*3/uL (ref 4.5–10.5)

## 2012-01-20 LAB — LIPID PANEL
Cholesterol: 197 mg/dL (ref 0–200)
HDL: 48.3 mg/dL (ref >39.00)
Total CHOL/HDL Ratio: 4
Triglycerides: 228 mg/dL — ABNORMAL HIGH (ref 0.0–149.0)
VLDL: 45.6 mg/dL — ABNORMAL HIGH (ref 0.0–40.0)

## 2012-01-20 LAB — PSA: PSA: 0.69 ng/mL (ref 0.10–4.00)

## 2012-01-20 LAB — HEPATIC FUNCTION PANEL
Bilirubin, Direct: 0.2 mg/dL (ref 0.0–0.3)
Total Bilirubin: 1 mg/dL (ref 0.3–1.2)

## 2012-01-20 MED ORDER — SILDENAFIL CITRATE 100 MG PO TABS: 100.0000 mg | ORAL_TABLET | ORAL | Status: DC | PRN

## 2012-01-20 MED ORDER — SIMVASTATIN 40 MG PO TABS: 40.0000 mg | ORAL_TABLET | Freq: Every day | ORAL | Status: DC

## 2012-01-20 NOTE — Patient Instructions (Addendum)
Today we updated your med list in our EPIC system...    Continue your current medications the same...    We refilled your meds per request...  Today we did your follow up CXR & FASTING blood work...  We also gave you the combination tetanus vaccine called the TDAP (good for 10 yrs)...  Call for any problems...  Let's plan a similar physical in 1 yrs time.Brian KitchenMarland [name]
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
## 2012-01-24 ENCOUNTER — Telehealth: Payer: Self-pay | Admitting: Pulmonary Disease

## 2012-01-24 MED ORDER — AZITHROMYCIN 250 MG PO TABS: ORAL_TABLET | ORAL | Status: DC

## 2012-01-24 NOTE — Telephone Encounter (Signed)
Spoke with patient, c/o nose congestion and runny nose with clear mucus x 2 days. Patient was seen 01/20/12 with SN and started feeling bad Sunday night. Patient requesting Zpak to be called in to [facility] [address]. Patient is requesting ONE refill so he can have it for later if need be.  Dr Kriste Basque please advise. Thanks  No Known Allergies

## 2012-01-24 NOTE — Telephone Encounter (Signed)
Per SN--ok to call in zpak # 1  Take as directed with no refills. Called and spoke with pt and he is aware of rx sent to [address]. Nothing further is needed.

## 2012-03-08 ENCOUNTER — Telehealth: Payer: Self-pay | Admitting: Internal Medicine

## 2012-03-08 NOTE — Telephone Encounter (Signed)
New Problem:    Patient called in needing a refill of his propranolol (INDERAL LA) 60 MG 24 hr capsule sent in to [facility].

## 2012-03-09 ENCOUNTER — Other Ambulatory Visit: Payer: Self-pay | Admitting: *Deleted

## 2012-03-09 MED ORDER — PROPRANOLOL HCL ER 60 MG PO CP24: 60.0000 mg | ORAL_CAPSULE | Freq: Every day | ORAL | Status: DC

## 2012-04-16 ENCOUNTER — Telehealth: Payer: Self-pay | Admitting: Pulmonary Disease

## 2012-04-16 DIAGNOSIS — N320 Bladder-neck obstruction: Secondary | ICD-10-CM

## 2012-04-16 DIAGNOSIS — E785 Hyperlipidemia, unspecified: Secondary | ICD-10-CM

## 2012-04-16 DIAGNOSIS — I10 Essential (primary) hypertension: Secondary | ICD-10-CM

## 2012-04-16 DIAGNOSIS — D126 Benign neoplasm of colon, unspecified: Secondary | ICD-10-CM

## 2012-04-16 DIAGNOSIS — F419 Anxiety disorder, unspecified: Secondary | ICD-10-CM

## 2012-04-18 NOTE — Telephone Encounter (Signed)
Called, spoke with pt.  I assured pt that we have sent msg to Dr. Kriste Basque to advise on labs that he needs to have drawn next week.  Pt did verify that he would like to come in next Wed, Thurs, or Fri for these labs.  Pt states he does want to have them done here at the [facility] -- advised no paperwork was needed for this, we would place orders in the computer and lab would be able to pull them from his chart via the computer.  He verbalized understanding of this and was ok with this process.  Dr. Kriste Basque, pls advise on labs you would like pt to have.  Thank you.

## 2012-04-18 NOTE — Telephone Encounter (Signed)
Pt has called back.  Pt is upset that he has not been reached since leaving his message with our office on Monday.  Pt would like to know if he can get his labs ordered & complete next Wed, Thurs, Fri.  Also, pt states it is his understanding that he will need to carry some paperwork to the lab for this.  I told pt I was unsure about that, but would have SN's nurse check on that & advise.  Pt can be reached at [phone].  [name]

## 2012-04-18 NOTE — Telephone Encounter (Signed)
Labs are in the computer for the pt thanks 

## 2012-04-18 NOTE — Telephone Encounter (Signed)
Called spoke with patient, advised lab orders are in the computer.  Pt aware to be fasting.  Nothing further needed; will sign off.

## 2012-04-25 ENCOUNTER — Other Ambulatory Visit (INDEPENDENT_AMBULATORY_CARE_PROVIDER_SITE_OTHER): Payer: BC Managed Care – PPO

## 2012-04-25 DIAGNOSIS — F411 Generalized anxiety disorder: Secondary | ICD-10-CM

## 2012-04-25 DIAGNOSIS — I10 Essential (primary) hypertension: Secondary | ICD-10-CM

## 2012-04-25 DIAGNOSIS — N320 Bladder-neck obstruction: Secondary | ICD-10-CM

## 2012-04-25 DIAGNOSIS — E785 Hyperlipidemia, unspecified: Secondary | ICD-10-CM

## 2012-04-25 DIAGNOSIS — F419 Anxiety disorder, unspecified: Secondary | ICD-10-CM

## 2012-04-25 DIAGNOSIS — D126 Benign neoplasm of colon, unspecified: Secondary | ICD-10-CM

## 2012-04-25 LAB — LIPID PANEL
HDL: 36.4 mg/dL — ABNORMAL LOW (ref >39.00)
Total CHOL/HDL Ratio: 3

## 2012-04-25 LAB — HEPATIC FUNCTION PANEL
ALT: 28 U/L (ref 0–53)
AST: 22 U/L (ref 0–37)
Albumin: 4.2 g/dL (ref 3.5–5.2)
Alkaline Phosphatase: 56 U/L (ref 39–117)
Bilirubin, Direct: 0.2 mg/dL (ref 0.0–0.3)
Total Protein: 6.9 g/dL (ref 6.0–8.3)

## 2012-04-25 LAB — CBC WITH DIFFERENTIAL/PLATELET
Basophils Absolute: 0 10*3/uL (ref 0.0–0.1)
Eosinophils Absolute: 0.1 10*3/uL (ref 0.0–0.7)
Hemoglobin: 14.1 g/dL (ref 13.0–17.0)
Lymphocytes Relative: 19 % (ref 12.0–46.0)
MCHC: 33.4 g/dL (ref 30.0–36.0)
Monocytes Relative: 10.2 % (ref 3.0–12.0)
Neutro Abs: 3.9 10*3/uL (ref 1.4–7.7)
Neutrophils Relative %: 68.3 % (ref 43.0–77.0)
Platelets: 266 10*3/uL (ref 150.0–400.0)
RDW: 12.5 % (ref 11.5–14.6)

## 2012-04-25 LAB — BASIC METABOLIC PANEL
CO2: 30 mEq/L (ref 19–32)
Calcium: 9.2 mg/dL (ref 8.4–10.5)
Chloride: 102 mEq/L (ref 96–112)
Glucose, Bld: 91 mg/dL (ref 70–99)
Potassium: 5.3 mEq/L — ABNORMAL HIGH (ref 3.5–5.1)
Sodium: 139 mEq/L (ref 135–145)

## 2012-04-25 LAB — TSH: TSH: 1.41 u[IU]/mL (ref 0.35–5.50)

## 2012-08-08 ENCOUNTER — Telehealth: Payer: Self-pay | Admitting: Pulmonary Disease

## 2012-08-08 NOTE — Telephone Encounter (Signed)
Spoke with patient-aware that I will send to Leigh to follow through with. Pt aware that we need his insurance card as he has new insurance this year. Pt will bring this to the office on Tuesday 08-14-12 when he is back in town from [place].

## 2012-08-10 NOTE — Telephone Encounter (Signed)
[facility]. Was advised there is already PA opened --case # [id number]. I answered questions over the phone. PA was approved until 08/10/2013. I called and made pt aware. Nothing further was needed

## 2012-12-25 DIAGNOSIS — L659 Nonscarring hair loss, unspecified: Secondary | ICD-10-CM | POA: Insufficient documentation

## 2012-12-26 ENCOUNTER — Encounter: Payer: Self-pay | Admitting: Internal Medicine

## 2012-12-26 ENCOUNTER — Ambulatory Visit (INDEPENDENT_AMBULATORY_CARE_PROVIDER_SITE_OTHER): Payer: BC Managed Care – PPO | Admitting: Internal Medicine

## 2012-12-26 VITALS — BP 132/80 | HR 67 | Ht 72.0 in | Wt 223.8 lb

## 2012-12-26 DIAGNOSIS — I471 Supraventricular tachycardia, unspecified: Secondary | ICD-10-CM

## 2012-12-26 DIAGNOSIS — L659 Nonscarring hair loss, unspecified: Secondary | ICD-10-CM

## 2012-12-26 DIAGNOSIS — Z23 Encounter for immunization: Secondary | ICD-10-CM

## 2012-12-26 DIAGNOSIS — I10 Essential (primary) hypertension: Secondary | ICD-10-CM

## 2012-12-26 DIAGNOSIS — R9431 Abnormal electrocardiogram [ECG] [EKG]: Secondary | ICD-10-CM

## 2012-12-26 MED ORDER — PROPRANOLOL HCL 10 MG PO TABS: ORAL_TABLET | ORAL | Status: DC

## 2012-12-26 MED ORDER — DILTIAZEM HCL ER COATED BEADS 180 MG PO CP24: 180.0000 mg | ORAL_CAPSULE | Freq: Every day | ORAL | Status: DC

## 2012-12-26 NOTE — Patient Instructions (Addendum)
Your physician has recommended you make the following change in your medication:  1) Stop Propranolol  2) Start Cardizem CD 180mg  daily 3) Propranolol 10mg  if needed for increased heart rate, may take one more in 30 minutes if no result from first pill  Your physician wants you to follow-up in: one year with Dr. Graciela Husbands.  You will receive a reminder letter in the mail two months in advance. If you don't receive a letter, please call our office to schedule the follow-up appointment.  You will receive flu shot today

## 2012-12-26 NOTE — Assessment & Plan Note (Signed)
Patient is having increasingly frequent and more symptomatic episodes of SVT. They are frog negative and diuretic negative. At this juncture he would like not to pursue ablative therapy.  Given the increasing frequency and the symptoms of presyncope we will change his medication. Because of alopecia we will not use a beta blocker. Begin him on diltiazem 120 mg daily. Will see him in one years time and he will call us if there are more problems in the interim
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
## 2012-12-26 NOTE — Assessment & Plan Note (Signed)
He is having alopecia with beta blockers. We'll change to calcium blocker.

## 2012-12-26 NOTE — Assessment & Plan Note (Signed)
Reasonably controlled 

## 2012-12-31 ENCOUNTER — Telehealth: Payer: Self-pay | Admitting: *Deleted

## 2012-12-31 NOTE — Telephone Encounter (Signed)
[facility] interaction of cardizem cd and Zocor 40 mg, states not recommended with dose of Zocor above 20 mg, will route to MD

## 2013-01-01 NOTE — Telephone Encounter (Signed)
They are correct,\ Reduce simva >>20

## 2013-01-02 MED ORDER — SIMVASTATIN 40 MG PO TABS: 20.0000 mg | ORAL_TABLET | Freq: Every day | ORAL | Status: DC

## 2013-01-02 NOTE — Telephone Encounter (Signed)
NOTIFIED PATIENT BY PHONE TO TAKE 20 MG (1/2 OF HIS 40 MG)

## 2013-01-23 ENCOUNTER — Encounter: Payer: Self-pay | Admitting: Pulmonary Disease

## 2013-01-23 ENCOUNTER — Ambulatory Visit (INDEPENDENT_AMBULATORY_CARE_PROVIDER_SITE_OTHER): Payer: BC Managed Care – PPO | Admitting: Pulmonary Disease

## 2013-01-23 ENCOUNTER — Other Ambulatory Visit (INDEPENDENT_AMBULATORY_CARE_PROVIDER_SITE_OTHER): Payer: BC Managed Care – PPO

## 2013-01-23 VITALS — BP 132/76 | HR 70 | Temp 98.6°F | Ht 72.0 in | Wt 224.8 lb

## 2013-01-23 DIAGNOSIS — I10 Essential (primary) hypertension: Secondary | ICD-10-CM

## 2013-01-23 DIAGNOSIS — E785 Hyperlipidemia, unspecified: Secondary | ICD-10-CM

## 2013-01-23 DIAGNOSIS — Z0000 Encounter for general adult medical examination without abnormal findings: Secondary | ICD-10-CM

## 2013-01-23 DIAGNOSIS — I471 Supraventricular tachycardia: Secondary | ICD-10-CM

## 2013-01-23 DIAGNOSIS — D126 Benign neoplasm of colon, unspecified: Secondary | ICD-10-CM

## 2013-01-23 LAB — CBC WITH DIFFERENTIAL/PLATELET
Eosinophils Relative: 2.5 % (ref 0.0–5.0)
HCT: 43.6 % (ref 39.0–52.0)
Hemoglobin: 14.8 g/dL (ref 13.0–17.0)
Lymphocytes Relative: 13.7 % (ref 12.0–46.0)
Lymphs Abs: 0.9 10*3/uL (ref 0.7–4.0)
MCV: 91.8 fl (ref 78.0–100.0)
Monocytes Absolute: 0.7 10*3/uL (ref 0.1–1.0)
Monocytes Relative: 11.3 % (ref 3.0–12.0)
Neutro Abs: 4.6 10*3/uL (ref 1.4–7.7)
RDW: 12.9 % (ref 11.5–14.6)
WBC: 6.3 10*3/uL (ref 4.5–10.5)

## 2013-01-23 LAB — HEPATIC FUNCTION PANEL
Albumin: 4.4 g/dL (ref 3.5–5.2)
Alkaline Phosphatase: 72 U/L (ref 39–117)
Total Bilirubin: 1.2 mg/dL (ref 0.3–1.2)
Total Protein: 7.2 g/dL (ref 6.0–8.3)

## 2013-01-23 LAB — LIPID PANEL
HDL: 51.1 mg/dL (ref >39.00)
LDL Cholesterol: 89 mg/dL (ref 0–99)
VLDL: 19.6 mg/dL (ref 0.0–40.0)

## 2013-01-23 LAB — BASIC METABOLIC PANEL
Calcium: 9 mg/dL (ref 8.4–10.5)
Creatinine, Ser: 0.8 mg/dL (ref 0.4–1.5)
GFR: 105.47 mL/min (ref >60.00)
Glucose, Bld: 90 mg/dL (ref 70–99)
Potassium: 4.6 mEq/L (ref 3.5–5.1)
Sodium: 139 mEq/L (ref 135–145)

## 2013-01-23 LAB — TSH: TSH: 1.32 u[IU]/mL (ref 0.35–5.50)

## 2013-01-23 MED ORDER — SILDENAFIL CITRATE 100 MG PO TABS: 100.0000 mg | ORAL_TABLET | ORAL | Status: DC | PRN

## 2013-01-23 NOTE — Patient Instructions (Signed)
Today we updated your med list in our EPIC system...    Continue your current medications the same...    We refilled the meds you requested...  Today we did your follow up FASTING blood work...    We will contact you w/ the results when available...   Congrats on your retirement- now it's time to crank up the exercise program!!!  Call for any questions...  Let's plan a follow up visit in 74yr, sooner if needed for problems.Marland KitchenMarland Kitchen
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
## 2013-01-24 ENCOUNTER — Telehealth: Payer: Self-pay | Admitting: Cardiovascular Disease

## 2013-01-24 NOTE — Telephone Encounter (Signed)
I spoke with pt & he is thinking about having some hair relocated from his back to the front of his head. He is aware Dr. Graciela Husbands is out of the office until next week. Pt states this is not an emergency & understands  Mylo Red RN

## 2013-01-24 NOTE — Telephone Encounter (Signed)
New message     Saw dr Graciela Husbands last month---pt need procedure and need clearance to use epinephrine 1-100,000.  Pls fax clearance to [phone]. Pls call pt and let him know what doctor says

## 2013-02-01 NOTE — Telephone Encounter (Signed)
Per Dr. Graciela Husbands, ok to try procedure. Patient agreeable to plan. I explained that I would fax clearance to number given next week.

## 2013-02-04 ENCOUNTER — Encounter: Payer: Self-pay | Admitting: *Deleted

## 2013-02-05 ENCOUNTER — Encounter: Payer: Self-pay | Admitting: *Deleted

## 2013-03-18 ENCOUNTER — Telehealth: Payer: Self-pay | Admitting: Pulmonary Disease

## 2013-03-18 NOTE — Telephone Encounter (Signed)
Will place the letter on SN cart to review and will call pt once this has been done.

## 2013-03-27 NOTE — Telephone Encounter (Signed)
Called and spoke with [facility] and they stated that the pt has a PA that is good through 08/10/2013.  She stated that this medication is covered until that time but will need PA after this date. They did a test claim and this came back fine.  i have called the pt and he is aware. Nothing further is needed.

## 2013-07-02 ENCOUNTER — Telehealth: Payer: Self-pay | Admitting: Pulmonary Disease

## 2013-07-02 MED ORDER — FLUTICASONE PROPIONATE 50 MCG/ACT NA SUSP: 2.0000 | Freq: Every day | NASAL | Status: DC

## 2013-07-02 MED ORDER — METHYLPREDNISOLONE 4 MG PO KIT: PACK | ORAL | Status: DC

## 2013-07-02 MED ORDER — FEXOFENADINE HCL 180 MG PO TABS: 180.0000 mg | ORAL_TABLET | Freq: Every day | ORAL | Status: DC

## 2013-07-02 NOTE — Telephone Encounter (Signed)
Per SN---  Allegra 180 mg daily Nasal saline spray every 1-2 hours while awake flonase 2 sprays each nostril qhs Start medrol dosepak #1  Take as directed

## 2013-07-02 NOTE — Telephone Encounter (Signed)
Pt advised of recs and rx sent. Chi Garlow, CMA  
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
## 2013-11-29 ENCOUNTER — Other Ambulatory Visit: Payer: Self-pay | Admitting: Internal Medicine

## 2013-12-03 DIAGNOSIS — D229 Melanocytic nevi, unspecified: Secondary | ICD-10-CM

## 2013-12-03 HISTORY — DX: Melanocytic nevi, unspecified: D22.9

## 2014-01-14 ENCOUNTER — Ambulatory Visit (INDEPENDENT_AMBULATORY_CARE_PROVIDER_SITE_OTHER): Payer: BC Managed Care – PPO | Admitting: Internal Medicine

## 2014-01-14 ENCOUNTER — Encounter: Payer: Self-pay | Admitting: Internal Medicine

## 2014-01-14 VITALS — BP 134/72 | HR 76 | Ht 72.0 in | Wt 222.0 lb

## 2014-01-14 DIAGNOSIS — I471 Supraventricular tachycardia: Secondary | ICD-10-CM

## 2014-01-14 NOTE — Patient Instructions (Signed)
Your physician recommends that you continue on your current medications as directed. Please refer to the Current Medication list given to you today.  Your physician wants you to follow-up in: 1 year with Dr. Klein.  You will receive a reminder letter in the mail two months in advance. If you don't receive a letter, please call our office to schedule the follow-up appointment.  
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
## 2014-01-21 ENCOUNTER — Other Ambulatory Visit: Payer: Self-pay | Admitting: *Deleted

## 2014-01-21 ENCOUNTER — Encounter: Payer: Self-pay | Admitting: Internal Medicine

## 2014-01-21 MED ORDER — DILTIAZEM HCL ER COATED BEADS 180 MG PO CP24: ORAL_CAPSULE | ORAL | Status: DC

## 2014-01-23 ENCOUNTER — Ambulatory Visit (INDEPENDENT_AMBULATORY_CARE_PROVIDER_SITE_OTHER)
Admission: RE | Admit: 2014-01-23 | Discharge: 2014-01-23 | Disposition: A | Discharge status: Home / Self Care | Payer: BC Managed Care – PPO | Source: Ambulatory Visit | Attending: Pulmonary Disease | Admitting: Pulmonary Disease

## 2014-01-23 ENCOUNTER — Other Ambulatory Visit (INDEPENDENT_AMBULATORY_CARE_PROVIDER_SITE_OTHER): Payer: BC Managed Care – PPO

## 2014-01-23 ENCOUNTER — Encounter: Payer: Self-pay | Admitting: Pulmonary Disease

## 2014-01-23 ENCOUNTER — Ambulatory Visit (INDEPENDENT_AMBULATORY_CARE_PROVIDER_SITE_OTHER): Payer: BC Managed Care – PPO | Admitting: Pulmonary Disease

## 2014-01-23 VITALS — BP 120/78 | HR 62 | Temp 98.1°F | Ht 72.0 in | Wt 221.0 lb

## 2014-01-23 DIAGNOSIS — I10 Essential (primary) hypertension: Secondary | ICD-10-CM

## 2014-01-23 DIAGNOSIS — Z0000 Encounter for general adult medical examination without abnormal findings: Secondary | ICD-10-CM

## 2014-01-23 DIAGNOSIS — I471 Supraventricular tachycardia: Secondary | ICD-10-CM

## 2014-01-23 DIAGNOSIS — D126 Benign neoplasm of colon, unspecified: Secondary | ICD-10-CM

## 2014-01-23 DIAGNOSIS — M170 Bilateral primary osteoarthritis of knee: Secondary | ICD-10-CM

## 2014-01-23 DIAGNOSIS — I4510 Unspecified right bundle-branch block: Secondary | ICD-10-CM

## 2014-01-23 DIAGNOSIS — E782 Mixed hyperlipidemia: Secondary | ICD-10-CM

## 2014-01-23 DIAGNOSIS — Z23 Encounter for immunization: Secondary | ICD-10-CM

## 2014-01-23 LAB — CBC WITH DIFFERENTIAL/PLATELET
Basophils Absolute: 0 10*3/uL (ref 0.0–0.1)
Basophils Relative: 0.4 % (ref 0.0–3.0)
EOS ABS: 0.2 10*3/uL (ref 0.0–0.7)
Eosinophils Relative: 2.6 % (ref 0.0–5.0)
HCT: 47 % (ref 39.0–52.0)
HEMOGLOBIN: 15.6 g/dL (ref 13.0–17.0)
Lymphocytes Relative: 17.5 % (ref 12.0–46.0)
Lymphs Abs: 1.1 10*3/uL (ref 0.7–4.0)
MCHC: 33.2 g/dL (ref 30.0–36.0)
MCV: 90.8 fl (ref 78.0–100.0)
Monocytes Absolute: 0.7 10*3/uL (ref 0.1–1.0)
Monocytes Relative: 11 % (ref 3.0–12.0)
Neutro Abs: 4.4 10*3/uL (ref 1.4–7.7)
Neutrophils Relative %: 68.5 % (ref 43.0–77.0)
Platelets: 272 10*3/uL (ref 150.0–400.0)
RBC: 5.18 Mil/uL (ref 4.22–5.81)
RDW: 12.7 % (ref 11.5–15.5)
WBC: 6.5 10*3/uL (ref 4.0–10.5)

## 2014-01-23 LAB — BASIC METABOLIC PANEL
BUN: 11 mg/dL (ref 6–23)
CALCIUM: 9.6 mg/dL (ref 8.4–10.5)
CO2: 23 meq/L (ref 19–32)
CREATININE: 0.9 mg/dL (ref 0.4–1.5)
Chloride: 104 mEq/L (ref 96–112)
GFR: 94.05 mL/min (ref >60.00)
Glucose, Bld: 102 mg/dL — ABNORMAL HIGH (ref 70–99)
Potassium: 4.7 mEq/L (ref 3.5–5.1)
Sodium: 142 mEq/L (ref 135–145)

## 2014-01-23 LAB — HEPATIC FUNCTION PANEL
ALT: 25 U/L (ref 0–53)
AST: 26 U/L (ref 0–37)
Albumin: 3.9 g/dL (ref 3.5–5.2)
Alkaline Phosphatase: 76 U/L (ref 39–117)
BILIRUBIN DIRECT: 0.1 mg/dL (ref 0.0–0.3)
BILIRUBIN TOTAL: 0.8 mg/dL (ref 0.2–1.2)
TOTAL PROTEIN: 7.2 g/dL (ref 6.0–8.3)

## 2014-01-23 LAB — LIPID PANEL
CHOL/HDL RATIO: 4
Cholesterol: 181 mg/dL (ref 0–200)
HDL: 48.4 mg/dL (ref >39.00)
LDL CALC: 103 mg/dL — AB (ref 0–99)
NONHDL: 132.6
Triglycerides: 150 mg/dL — ABNORMAL HIGH (ref 0.0–149.0)
VLDL: 30 mg/dL (ref 0.0–40.0)

## 2014-01-23 LAB — HEPATITIS C ANTIBODY: HCV Ab: NEGATIVE

## 2014-01-23 LAB — TSH: TSH: 1.25 u[IU]/mL (ref 0.35–4.50)

## 2014-01-23 LAB — PSA: PSA: 1.64 ng/mL (ref 0.10–4.00)

## 2014-01-23 MED ORDER — SILDENAFIL CITRATE 100 MG PO TABS: 100.0000 mg | ORAL_TABLET | ORAL | Status: DC | PRN

## 2014-01-23 MED ORDER — FLUTICASONE PROPIONATE 50 MCG/ACT NA SUSP: 2.0000 | Freq: Every day | NASAL | Status: DC

## 2014-01-23 MED ORDER — SIMVASTATIN 40 MG PO TABS: 20.0000 mg | ORAL_TABLET | Freq: Every day | ORAL | Status: DC

## 2014-01-23 NOTE — Patient Instructions (Signed)
Today we updated your med list in our EPIC system...    Continue your current medications the same...  We refilled your meds and added a prescription for FLONASE...  Today we did your follow up CXR & FASTING blood work...    We will contact you w/ the results when available...   Keep up the good work w/ diet, exercise, alcohol moderation, etc...  Call for any questions...  Let's plan a follow up visit in 66yr, sooner if needed for problems.Marland KitchenMarland Kitchen
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
## 2014-03-27 ENCOUNTER — Other Ambulatory Visit: Payer: Self-pay | Admitting: Pulmonary Disease

## 2014-03-28 ENCOUNTER — Telehealth: Payer: Self-pay | Admitting: Pulmonary Disease

## 2014-03-28 NOTE — Telephone Encounter (Signed)
Called and spoke with pt and he is aware of flonase that has been sent to the pharmacy.  Pt is aware and nothing further is needed.

## 2014-04-09 ENCOUNTER — Telehealth: Payer: Self-pay | Admitting: Pulmonary Disease

## 2014-04-09 MED ORDER — AZITHROMYCIN 250 MG PO TABS: ORAL_TABLET | ORAL | Status: DC

## 2014-04-09 NOTE — Telephone Encounter (Signed)
Called and spoke with pt and he stated that he has a sinus infection and is requesting that zpak be sent in to his pharmacy.  Pt stated that his always helps him.  Per SN---  Ok to send in zpak  #1  Take as directed.  Pt is aware that this has been sent to the pharmacy

## 2014-06-23 ENCOUNTER — Telehealth: Payer: Self-pay | Admitting: Pulmonary Disease

## 2014-06-23 MED ORDER — AZITHROMYCIN 250 MG PO TABS: 250.0000 mg | ORAL_TABLET | ORAL | Status: DC

## 2014-06-23 NOTE — Telephone Encounter (Signed)
Patient aware that [place] sent to [facility]. Nothing further needed.

## 2014-06-23 NOTE — Telephone Encounter (Signed)
Ok for z pak.

## 2014-06-23 NOTE — Telephone Encounter (Signed)
Spoke with pt, c/o sinus congestion, PND X3-4 days.  Pt having a prod cough with yellow mucus.  Pt has been taking robitussin to suppress cough as well as an antihistamine.  Pt is requesting a zpak.  [facility] uses [facility] on [address].   Last ov: 01/23/14 Next ov:  01/26/15  Sending to doc of day as SN is unavailable this afternoon.   Dr. Elsworth Soho please advise.  Thanks!

## 2015-01-15 ENCOUNTER — Ambulatory Visit (INDEPENDENT_AMBULATORY_CARE_PROVIDER_SITE_OTHER): Payer: BLUE CROSS/BLUE SHIELD | Admitting: Internal Medicine

## 2015-01-15 ENCOUNTER — Encounter: Payer: Self-pay | Admitting: Internal Medicine

## 2015-01-15 VITALS — BP 120/80 | HR 63 | Ht 72.0 in | Wt 224.5 lb

## 2015-01-15 DIAGNOSIS — I10 Essential (primary) hypertension: Secondary | ICD-10-CM

## 2015-01-15 DIAGNOSIS — E782 Mixed hyperlipidemia: Secondary | ICD-10-CM

## 2015-01-15 MED ORDER — DILTIAZEM HCL ER COATED BEADS 180 MG PO CP24: ORAL_CAPSULE | ORAL | Status: DC
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
## 2015-01-15 NOTE — Patient Instructions (Signed)
Medication Instructions: - no changes  Labwork: - none  Procedures/Testing: - none  Follow-Up: - Your physician wants you to follow-up in: 1 year with Tommye Standard, PA for Dr. Caryl Comes. You will receive a reminder letter in the mail two months in advance. If you don't receive a letter, please call our office to schedule the follow-up appointment.  Any Additional Special Instructions Will Be Listed Below (If Applicable). - none

## 2015-01-15 NOTE — Addendum Note (Signed)
Addended by: Alvis Lemmings C on: 01/15/2015 04:20 PM   Modules accepted: Orders

## 2015-01-26 ENCOUNTER — Encounter: Payer: Self-pay | Admitting: Pulmonary Disease

## 2015-01-26 ENCOUNTER — Ambulatory Visit (INDEPENDENT_AMBULATORY_CARE_PROVIDER_SITE_OTHER)
Admission: RE | Admit: 2015-01-26 | Discharge: 2015-01-26 | Disposition: A | Discharge status: Home / Self Care | Payer: BLUE CROSS/BLUE SHIELD | Source: Ambulatory Visit | Attending: Pulmonary Disease | Admitting: Pulmonary Disease

## 2015-01-26 ENCOUNTER — Telehealth: Payer: Self-pay | Admitting: Pulmonary Disease

## 2015-01-26 ENCOUNTER — Ambulatory Visit (INDEPENDENT_AMBULATORY_CARE_PROVIDER_SITE_OTHER): Payer: BLUE CROSS/BLUE SHIELD | Admitting: Pulmonary Disease

## 2015-01-26 ENCOUNTER — Other Ambulatory Visit (INDEPENDENT_AMBULATORY_CARE_PROVIDER_SITE_OTHER): Payer: BLUE CROSS/BLUE SHIELD

## 2015-01-26 VITALS — BP 122/70 | HR 80 | Temp 98.5°F | Wt 227.2 lb

## 2015-01-26 DIAGNOSIS — I10 Essential (primary) hypertension: Secondary | ICD-10-CM

## 2015-01-26 DIAGNOSIS — Z0000 Encounter for general adult medical examination without abnormal findings: Secondary | ICD-10-CM

## 2015-01-26 DIAGNOSIS — I4510 Unspecified right bundle-branch block: Secondary | ICD-10-CM

## 2015-01-26 DIAGNOSIS — M170 Bilateral primary osteoarthritis of knee: Secondary | ICD-10-CM

## 2015-01-26 DIAGNOSIS — D126 Benign neoplasm of colon, unspecified: Secondary | ICD-10-CM

## 2015-01-26 DIAGNOSIS — I471 Supraventricular tachycardia: Secondary | ICD-10-CM

## 2015-01-26 LAB — CBC WITH DIFFERENTIAL/PLATELET
BASOS PCT: 0.4 % (ref 0.0–3.0)
Basophils Absolute: 0 10*3/uL (ref 0.0–0.1)
EOS PCT: 2.3 % (ref 0.0–5.0)
Eosinophils Absolute: 0.2 10*3/uL (ref 0.0–0.7)
HCT: 46.1 % (ref 39.0–52.0)
Hemoglobin: 15.4 g/dL (ref 13.0–17.0)
LYMPHS ABS: 1.1 10*3/uL (ref 0.7–4.0)
Lymphocytes Relative: 16.4 % (ref 12.0–46.0)
MCHC: 33.4 g/dL (ref 30.0–36.0)
MCV: 91.3 fl (ref 78.0–100.0)
MONO ABS: 0.7 10*3/uL (ref 0.1–1.0)
MONOS PCT: 10.3 % (ref 3.0–12.0)
NEUTROS PCT: 70.6 % (ref 43.0–77.0)
Neutro Abs: 4.9 10*3/uL (ref 1.4–7.7)
Platelets: 279 10*3/uL (ref 150.0–400.0)
RBC: 5.05 Mil/uL (ref 4.22–5.81)
RDW: 13 % (ref 11.5–15.5)
WBC: 6.9 10*3/uL (ref 4.0–10.5)

## 2015-01-26 LAB — PSA: PSA: 0.95 ng/mL (ref 0.10–4.00)

## 2015-01-26 LAB — BASIC METABOLIC PANEL
BUN: 10 mg/dL (ref 6–23)
CALCIUM: 9.5 mg/dL (ref 8.4–10.5)
CHLORIDE: 103 meq/L (ref 96–112)
CO2: 29 meq/L (ref 19–32)
CREATININE: 0.85 mg/dL (ref 0.40–1.50)
GFR: 96.3 mL/min (ref >60.00)
Glucose, Bld: 107 mg/dL — ABNORMAL HIGH (ref 70–99)
Potassium: 4.5 mEq/L (ref 3.5–5.1)
Sodium: 141 mEq/L (ref 135–145)

## 2015-01-26 LAB — LIPID PANEL
CHOL/HDL RATIO: 3
Cholesterol: 170 mg/dL (ref 0–200)
HDL: 53.7 mg/dL (ref >39.00)
LDL Cholesterol: 86 mg/dL (ref 0–99)
NONHDL: 116.61
Triglycerides: 152 mg/dL — ABNORMAL HIGH (ref 0.0–149.0)
VLDL: 30.4 mg/dL (ref 0.0–40.0)

## 2015-01-26 LAB — HEPATIC FUNCTION PANEL
ALK PHOS: 73 U/L (ref 39–117)
ALT: 43 U/L (ref 0–53)
AST: 32 U/L (ref 0–37)
Albumin: 4.5 g/dL (ref 3.5–5.2)
BILIRUBIN DIRECT: 0.1 mg/dL (ref 0.0–0.3)
BILIRUBIN TOTAL: 0.6 mg/dL (ref 0.2–1.2)
Total Protein: 7.3 g/dL (ref 6.0–8.3)

## 2015-01-26 LAB — TSH: TSH: 2.27 u[IU]/mL (ref 0.35–4.50)

## 2015-01-26 LAB — VITAMIN B12: VITAMIN B 12: 1146 pg/mL — AB (ref 211–911)

## 2015-01-26 MED ORDER — SIMVASTATIN 40 MG PO TABS: 20.0000 mg | ORAL_TABLET | Freq: Every day | ORAL | Status: DC

## 2015-01-26 MED ORDER — FLUTICASONE PROPIONATE 50 MCG/ACT NA SUSP: 2.0000 | Freq: Every day | NASAL | Status: DC

## 2015-01-26 MED ORDER — SILDENAFIL CITRATE 100 MG PO TABS: 100.0000 mg | ORAL_TABLET | ORAL | Status: DC | PRN

## 2015-01-26 NOTE — Telephone Encounter (Signed)
Rx has been sent in. Pt is aware. Nothing further was needed. 
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
## 2015-01-26 NOTE — Patient Instructions (Signed)
Today we updated your med list in our EPIC system...    Continue your current medications the same...    We refilled the meds you requested...  Today we did your follow up CXR & FASTING blood work...    We will contact you w/ the results when available...   Stay as active as possible, and work on weight reduction...  Good luck w/ the Ortho eval by DrAlusio...  Call for any questions...  Let's plan a follow up visit in 58yr, sooner if needed for problems.Marland KitchenMarland Kitchen

## 2015-01-28 NOTE — Progress Notes (Signed)
Quick Note:  Spoke with pt and notified of results per Dr. Nadel. Pt verbalized understanding and denied any questions.  ______ 

## 2015-03-18 ENCOUNTER — Telehealth: Payer: Self-pay | Admitting: Pulmonary Disease

## 2015-03-18 NOTE — Telephone Encounter (Signed)
LMOMTCB x 1 

## 2015-03-19 MED ORDER — PREDNISONE 5 MG (21) PO TBPK: ORAL_TABLET | ORAL | Status: DC

## 2015-03-19 MED ORDER — AMOXICILLIN-POT CLAVULANATE 875-125 MG PO TABS: 1.0000 | ORAL_TABLET | Freq: Two times a day (BID) | ORAL | Status: DC

## 2015-03-19 NOTE — Telephone Encounter (Signed)
[phone] pt calling back

## 2015-03-19 NOTE — Telephone Encounter (Signed)
Per SN >> Zpack, Prednisone dose pack 5mg .  Pt is aware of SN's recommendations. Rx's have been sent in. Nothing further was needed.

## 2015-03-19 NOTE — Telephone Encounter (Signed)
Spoke with pt. He c/o prod cough (clear phlem), lots of nasal cong/PND, very little wheezing/chest tx, some chest cong, chills (but not fever) x couple days. Has been taking nyquil. Wants ABX called in. Please advise SN thanks  No Known Allergies   Current Outpatient Prescriptions on File Prior to Visit  Medication Sig Dispense Refill  . aspirin 81 MG tablet Take 81 mg by mouth daily.      . Cyanocobalamin (VITAMIN B 12 PO) Take 1 tablet by mouth daily.    Marland Kitchen diltiazem (CARTIA XT) 180 MG 24 hr capsule TAKE 1 CAPSULE DAILY 30 capsule 11  . fluticasone (FLONASE) 50 MCG/ACT nasal spray Place 2 sprays into both nostrils daily. 48 g 3  . Multiple Vitamin (MULTIVITAMIN) tablet Take 1 tablet by mouth daily.      . sildenafil (VIAGRA) 100 MG tablet Take 1 tablet (100 mg total) by mouth as needed for erectile dysfunction. 18 tablet 3  . simvastatin (ZOCOR) 40 MG tablet Take 0.5 tablets (20 mg total) by mouth at bedtime. 90 tablet 3   No current facility-administered medications on file prior to visit.

## 2015-04-13 ENCOUNTER — Other Ambulatory Visit: Payer: Self-pay | Admitting: Internal Medicine

## 2015-09-22 DIAGNOSIS — Z6830 Body mass index (BMI) 30.0-30.9, adult: Secondary | ICD-10-CM | POA: Diagnosis not present

## 2015-09-22 DIAGNOSIS — L509 Urticaria, unspecified: Secondary | ICD-10-CM | POA: Diagnosis not present

## 2015-10-06 ENCOUNTER — Other Ambulatory Visit: Payer: Self-pay | Admitting: *Deleted

## 2015-10-06 MED ORDER — DILTIAZEM HCL ER COATED BEADS 180 MG PO CP24: ORAL_CAPSULE | ORAL | 3 refills | Status: DC

## 2015-10-08 DIAGNOSIS — M25561 Pain in right knee: Secondary | ICD-10-CM | POA: Diagnosis not present

## 2015-10-08 DIAGNOSIS — M1711 Unilateral primary osteoarthritis, right knee: Secondary | ICD-10-CM | POA: Diagnosis not present

## 2015-11-26 DIAGNOSIS — M1712 Unilateral primary osteoarthritis, left knee: Secondary | ICD-10-CM | POA: Diagnosis not present

## 2015-12-02 DIAGNOSIS — M1712 Unilateral primary osteoarthritis, left knee: Secondary | ICD-10-CM | POA: Diagnosis not present

## 2015-12-09 DIAGNOSIS — M1712 Unilateral primary osteoarthritis, left knee: Secondary | ICD-10-CM | POA: Diagnosis not present

## 2015-12-21 DIAGNOSIS — Z23 Encounter for immunization: Secondary | ICD-10-CM | POA: Diagnosis not present

## 2015-12-23 DIAGNOSIS — H61003 Unspecified perichondritis of external ear, bilateral: Secondary | ICD-10-CM | POA: Diagnosis not present

## 2015-12-23 DIAGNOSIS — D239 Other benign neoplasm of skin, unspecified: Secondary | ICD-10-CM | POA: Diagnosis not present

## 2015-12-23 DIAGNOSIS — L821 Other seborrheic keratosis: Secondary | ICD-10-CM | POA: Diagnosis not present

## 2016-01-25 ENCOUNTER — Ambulatory Visit: Payer: BLUE CROSS/BLUE SHIELD | Admitting: Physician Assistant

## 2016-01-26 ENCOUNTER — Ambulatory Visit (INDEPENDENT_AMBULATORY_CARE_PROVIDER_SITE_OTHER)
Admission: RE | Admit: 2016-01-26 | Discharge: 2016-01-26 | Disposition: A | Discharge status: Home / Self Care | Payer: Medicare Other | Source: Ambulatory Visit | Attending: Pulmonary Disease | Admitting: Pulmonary Disease

## 2016-01-26 ENCOUNTER — Ambulatory Visit (INDEPENDENT_AMBULATORY_CARE_PROVIDER_SITE_OTHER): Payer: Medicare Other | Admitting: Pulmonary Disease

## 2016-01-26 ENCOUNTER — Encounter: Payer: Self-pay | Admitting: Pulmonary Disease

## 2016-01-26 ENCOUNTER — Other Ambulatory Visit (INDEPENDENT_AMBULATORY_CARE_PROVIDER_SITE_OTHER): Payer: Medicare Other

## 2016-01-26 VITALS — BP 138/88 | HR 76 | Temp 97.4°F | Ht 72.0 in | Wt 230.8 lb

## 2016-01-26 DIAGNOSIS — R918 Other nonspecific abnormal finding of lung field: Secondary | ICD-10-CM | POA: Diagnosis not present

## 2016-01-26 DIAGNOSIS — E782 Mixed hyperlipidemia: Secondary | ICD-10-CM

## 2016-01-26 DIAGNOSIS — Z0000 Encounter for general adult medical examination without abnormal findings: Secondary | ICD-10-CM | POA: Diagnosis not present

## 2016-01-26 DIAGNOSIS — N400 Enlarged prostate without lower urinary tract symptoms: Secondary | ICD-10-CM

## 2016-01-26 DIAGNOSIS — I10 Essential (primary) hypertension: Secondary | ICD-10-CM

## 2016-01-26 DIAGNOSIS — F411 Generalized anxiety disorder: Secondary | ICD-10-CM

## 2016-01-26 DIAGNOSIS — I471 Supraventricular tachycardia: Secondary | ICD-10-CM

## 2016-01-26 DIAGNOSIS — I4510 Unspecified right bundle-branch block: Secondary | ICD-10-CM

## 2016-01-26 DIAGNOSIS — D126 Benign neoplasm of colon, unspecified: Secondary | ICD-10-CM

## 2016-01-26 DIAGNOSIS — Z23 Encounter for immunization: Secondary | ICD-10-CM

## 2016-01-26 DIAGNOSIS — M170 Bilateral primary osteoarthritis of knee: Secondary | ICD-10-CM

## 2016-01-26 LAB — CBC WITH DIFFERENTIAL/PLATELET
BASOS PCT: 0.3 % (ref 0.0–3.0)
Basophils Absolute: 0 10*3/uL (ref 0.0–0.1)
EOS ABS: 0.2 10*3/uL (ref 0.0–0.7)
Eosinophils Relative: 3.2 % (ref 0.0–5.0)
HEMATOCRIT: 46.3 % (ref 39.0–52.0)
Hemoglobin: 15.8 g/dL (ref 13.0–17.0)
LYMPHS ABS: 1.2 10*3/uL (ref 0.7–4.0)
LYMPHS PCT: 18.6 % (ref 12.0–46.0)
MCHC: 34.2 g/dL (ref 30.0–36.0)
MCV: 92 fl (ref 78.0–100.0)
Monocytes Absolute: 0.9 10*3/uL (ref 0.1–1.0)
Monocytes Relative: 13 % — ABNORMAL HIGH (ref 3.0–12.0)
NEUTROS ABS: 4.3 10*3/uL (ref 1.4–7.7)
NEUTROS PCT: 64.9 % (ref 43.0–77.0)
PLATELETS: 271 10*3/uL (ref 150.0–400.0)
RBC: 5.03 Mil/uL (ref 4.22–5.81)
RDW: 12.9 % (ref 11.5–15.5)
WBC: 6.6 10*3/uL (ref 4.0–10.5)

## 2016-01-26 LAB — LIPID PANEL
CHOL/HDL RATIO: 3
Cholesterol: 187 mg/dL (ref 0–200)
HDL: 54.6 mg/dL (ref >39.00)
LDL CALC: 100 mg/dL — AB (ref 0–99)
NonHDL: 132.21
TRIGLYCERIDES: 160 mg/dL — AB (ref 0.0–149.0)
VLDL: 32 mg/dL (ref 0.0–40.0)

## 2016-01-26 LAB — COMPREHENSIVE METABOLIC PANEL
ALT: 47 U/L (ref 0–53)
AST: 30 U/L (ref 0–37)
Albumin: 4.5 g/dL (ref 3.5–5.2)
Alkaline Phosphatase: 72 U/L (ref 39–117)
BILIRUBIN TOTAL: 0.8 mg/dL (ref 0.2–1.2)
BUN: 9 mg/dL (ref 6–23)
CALCIUM: 9.4 mg/dL (ref 8.4–10.5)
CHLORIDE: 103 meq/L (ref 96–112)
CO2: 31 meq/L (ref 19–32)
Creatinine, Ser: 0.91 mg/dL (ref 0.40–1.50)
GFR: 88.74 mL/min (ref >60.00)
Glucose, Bld: 102 mg/dL — ABNORMAL HIGH (ref 70–99)
POTASSIUM: 5.1 meq/L (ref 3.5–5.1)
Sodium: 142 mEq/L (ref 135–145)
Total Protein: 7 g/dL (ref 6.0–8.3)

## 2016-01-26 LAB — PSA: PSA: 0.79 ng/mL (ref 0.10–4.00)

## 2016-01-26 LAB — TSH: TSH: 1.52 u[IU]/mL (ref 0.35–4.50)

## 2016-01-26 MED ORDER — SIMVASTATIN 40 MG PO TABS: 20.0000 mg | ORAL_TABLET | Freq: Every day | ORAL | 3 refills | Status: DC

## 2016-01-26 MED ORDER — SILDENAFIL CITRATE 100 MG PO TABS: 100.0000 mg | ORAL_TABLET | ORAL | 3 refills | Status: DC | PRN

## 2016-01-26 NOTE — Patient Instructions (Signed)
Today we updated your med list in our EPIC system...    Continue your current medications the same...    We refilled your meds per request...  We discussed the new blood pressure guidelines--     This requires a "lifesyle modification" w/ DIET & EXERCISE to get your weight down...       DIET=  Low sodium (no salt), low carb (no sweets), low fat diet...       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    We will contact you w/ the results when available...   Birth weight not on file gave you the last of the recommended pneumonia shots today ([id number])  Call for any questions...  Let's plan a follow up visit in 36yr, sooner if needed for problems.Marland KitchenMarland Kitchen

## 2016-01-27 ENCOUNTER — Encounter: Payer: Self-pay | Admitting: Pulmonary Disease
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
## 2016-01-28 ENCOUNTER — Encounter: Payer: Self-pay | Admitting: Internal Medicine

## 2016-01-28 ENCOUNTER — Ambulatory Visit (INDEPENDENT_AMBULATORY_CARE_PROVIDER_SITE_OTHER): Payer: Medicare Other | Admitting: Internal Medicine

## 2016-01-28 VITALS — BP 122/68 | HR 82 | Ht 72.0 in | Wt 231.6 lb

## 2016-01-28 DIAGNOSIS — R0602 Shortness of breath: Secondary | ICD-10-CM

## 2016-01-28 DIAGNOSIS — I471 Supraventricular tachycardia: Secondary | ICD-10-CM | POA: Diagnosis not present

## 2016-01-28 NOTE — Patient Instructions (Signed)
Medication Instructions: - Your physician recommends that you continue on your current medications as directed. Please refer to the Current Medication list given to you today.  Labwork: - none ordered  Procedures/Testing: - Your physician has requested that you have an exercise stress myoview. For further information please visit [email or web address]. Please follow instruction sheet, as given.  Follow-Up: - Your physician wants you to follow-up in: 1 year with Tommye Standard, PA for Dr. Caryl Comes. You will receive a reminder letter in the mail two months in advance. If you don't receive a letter, please call our office to schedule the follow-up appointment.  Any Additional Special Instructions Will Be Listed Below (If Applicable).     If you need a refill on your cardiac medications before your next appointment, please call your pharmacy.
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
## 2016-02-02 ENCOUNTER — Telehealth (HOSPITAL_COMMUNITY): Payer: Self-pay | Admitting: *Deleted

## 2016-02-02 ENCOUNTER — Telehealth (HOSPITAL_COMMUNITY): Payer: Self-pay | Admitting: Radiology

## 2016-02-02 NOTE — Telephone Encounter (Signed)
Patient given detailed instructions per Myocardial Perfusion Study Information Sheet for the test on 02/09/2016 at 7:30. Patient notified to arrive 15 minutes early and that it is imperative to arrive on time for appointment to keep from having the test rescheduled.  If you need to cancel or reschedule your appointment, please call the office within 24 hours of your appointment. Failure to do so may result in a cancellation of your appointment, and a $50 no show fee. Patient verbalized understanding.EHK

## 2016-02-02 NOTE — Telephone Encounter (Signed)
Left message on voicemail in reference to upcoming appointment scheduled for 02/09/16. Phone number given for a call back so details instructions can be given. [name], [name]

## 2016-02-09 ENCOUNTER — Ambulatory Visit (HOSPITAL_COMMUNITY): Payer: Medicare Other | Attending: Internal Medicine

## 2016-02-09 DIAGNOSIS — R0602 Shortness of breath: Secondary | ICD-10-CM | POA: Insufficient documentation

## 2016-02-09 LAB — MYOCARDIAL PERFUSION IMAGING
CHL CUP NUCLEAR SRS: 2
CHL CUP RESTING HR STRESS: 70 {beats}/min
CSEPED: 6 min
CSEPEDS: 0 s
CSEPHR: 86 %
CSEPPHR: 134 {beats}/min
Estimated workload: 7 METS
LVDIAVOL: 128 mL (ref 62–150)
LVSYSVOL: 55 mL
MPHR: 155 {beats}/min
RATE: 0.31
SDS: 3
SSS: 5
TID: 0.88

## 2016-02-09 MED ORDER — TECHNETIUM TC 99M TETROFOSMIN IV KIT
10.8000 | PACK | Freq: Once | INTRAVENOUS | Status: AC | PRN
  Administered 2016-02-09: 10.8 via INTRAVENOUS
  Filled 2016-02-09: qty 11

## 2016-02-09 MED ORDER — TECHNETIUM TC 99M TETROFOSMIN IV KIT
31.7000 | PACK | Freq: Once | INTRAVENOUS | Status: AC | PRN
  Administered 2016-02-09: 31.7 via INTRAVENOUS
  Filled 2016-02-09: qty 32

## 2016-06-16 DIAGNOSIS — M1711 Unilateral primary osteoarthritis, right knee: Secondary | ICD-10-CM | POA: Diagnosis not present

## 2016-06-23 DIAGNOSIS — M1711 Unilateral primary osteoarthritis, right knee: Secondary | ICD-10-CM | POA: Diagnosis not present

## 2016-07-01 DIAGNOSIS — M1711 Unilateral primary osteoarthritis, right knee: Secondary | ICD-10-CM | POA: Diagnosis not present

## 2016-10-09 ENCOUNTER — Other Ambulatory Visit: Payer: Self-pay | Admitting: Internal Medicine

## 2016-10-11 ENCOUNTER — Telehealth: Payer: Self-pay | Admitting: Internal Medicine

## 2016-10-11 NOTE — Telephone Encounter (Signed)
New message      *STAT* If patient is at the pharmacy, call can be transferred to refill team.   1. Which medications need to be refilled? (please list name of each medication and dose if known) diltiazem 180 mg  2. Which pharmacy/location (including street and city if local pharmacy) is medication to be sent to? [facility] on [address]  3. Do they need a 30 day or 90 day supply? 90 day

## 2016-10-11 NOTE — Telephone Encounter (Signed)
Called pt to inform him that his medication was sent in to his pharmacy on 10/10/16 and if he has any other problems, questions or concerns to call the office. Pt verbalized understanding.

## 2016-12-22 ENCOUNTER — Encounter: Payer: Self-pay | Admitting: Physician Assistant

## 2016-12-26 DIAGNOSIS — Z23 Encounter for immunization: Secondary | ICD-10-CM | POA: Diagnosis not present
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
## 2016-12-29 ENCOUNTER — Ambulatory Visit (INDEPENDENT_AMBULATORY_CARE_PROVIDER_SITE_OTHER): Payer: Medicare Other | Admitting: Physician Assistant

## 2016-12-29 VITALS — BP 136/74 | HR 65 | Ht 72.0 in | Wt 221.0 lb

## 2016-12-29 DIAGNOSIS — I471 Supraventricular tachycardia: Secondary | ICD-10-CM | POA: Diagnosis not present

## 2016-12-29 DIAGNOSIS — I10 Essential (primary) hypertension: Secondary | ICD-10-CM

## 2016-12-29 DIAGNOSIS — I779 Disorder of arteries and arterioles, unspecified: Secondary | ICD-10-CM | POA: Diagnosis not present

## 2016-12-29 LAB — BASIC METABOLIC PANEL
BUN/Creatinine Ratio: 12 (ref 10–24)
BUN: 10 mg/dL (ref 8–27)
CALCIUM: 9.4 mg/dL (ref 8.6–10.2)
CHLORIDE: 99 mmol/L (ref 96–106)
CO2: 25 mmol/L (ref 20–29)
Creatinine, Ser: 0.86 mg/dL (ref 0.76–1.27)
GFR calc Af Amer: 104 mL/min/{1.73_m2} (ref >59)
GFR calc non Af Amer: 90 mL/min/{1.73_m2} (ref >59)
GLUCOSE: 99 mg/dL (ref 65–99)
POTASSIUM: 4.5 mmol/L (ref 3.5–5.2)
Sodium: 141 mmol/L (ref 134–144)

## 2016-12-29 NOTE — Patient Instructions (Signed)
Medication Instructions:   Your physician recommends that you continue on your current medications as directed. Please refer to the Current Medication list given to you today.   If you need a refill on your cardiac medications before your next appointment, please call your pharmacy.  Labwork: BMET TODAY    Testing/Procedures: Your physician has requested that you have cardiac CT. Cardiac computed tomography (CT) is a painless test that uses an x-ray machine to take clear, detailed pictures of your heart. For further information please visit [email or web address]. Please follow instruction sheet as given.     Follow-Up: IN 3 MONTHS WITH KLEIN     Any Other Special Instructions Will Be Listed Below (If Applicable).

## 2017-01-03 ENCOUNTER — Ambulatory Visit (INDEPENDENT_AMBULATORY_CARE_PROVIDER_SITE_OTHER)
Admission: RE | Admit: 2017-01-03 | Discharge: 2017-01-03 | Disposition: A | Discharge status: Home / Self Care | Payer: Medicare Other | Source: Ambulatory Visit | Attending: Physician Assistant | Admitting: Physician Assistant

## 2017-01-03 ENCOUNTER — Telehealth: Payer: Self-pay | Admitting: Physician Assistant

## 2017-01-03 DIAGNOSIS — I779 Disorder of arteries and arterioles, unspecified: Secondary | ICD-10-CM

## 2017-01-03 DIAGNOSIS — K760 Fatty (change of) liver, not elsewhere classified: Secondary | ICD-10-CM | POA: Diagnosis not present

## 2017-01-03 DIAGNOSIS — I10 Essential (primary) hypertension: Secondary | ICD-10-CM | POA: Diagnosis not present

## 2017-01-03 MED ORDER — IOPAMIDOL (ISOVUE-370) INJECTION 76%
100.0000 mL | Freq: Once | INTRAVENOUS | Status: AC | PRN
  Administered 2017-01-03: 100 mL via INTRAVENOUS

## 2017-01-03 NOTE — Telephone Encounter (Signed)
Called and left a message for the patient for his CT scan results, was non-urgent and to call at his convenience.  Tommye Standard, PA-C

## 2017-01-04 ENCOUNTER — Telehealth: Payer: Self-pay | Admitting: Physician Assistant

## 2017-01-04 NOTE — Telephone Encounter (Signed)
Routing to [place], [place] for interpretation as patient is calling back from message left by her on 10/23 and I am unable to locate her comments

## 2017-01-04 NOTE — Telephone Encounter (Signed)
New message  Pt verbalized that he is calling for the rn  Pt want results for CT

## 2017-01-05 NOTE — Telephone Encounter (Signed)
Follow up     Patient calling for CT results. Please call

## 2017-01-05 NOTE — Telephone Encounter (Signed)
Called patient to let him know that a message has been sent to [facility] PA. Informed patient someone will call him back when the results are in. Patient verbalized understanding.

## 2017-01-06 ENCOUNTER — Telehealth: Payer: Self-pay | Admitting: Physician Assistant

## 2017-01-06 NOTE — Telephone Encounter (Signed)
NOTE ROUTED TO URSUY WAITING FOR A CALL BACK

## 2017-01-06 NOTE — Telephone Encounter (Signed)
Discussed results of CT scan with the patient. Will forward tohis PMD.  Tommye Standard, PA-C

## 2017-01-06 NOTE — Telephone Encounter (Signed)
Follow Up:     Pt calling for his CT results please.

## 2017-01-16 DIAGNOSIS — L821 Other seborrheic keratosis: Secondary | ICD-10-CM | POA: Diagnosis not present

## 2017-01-16 DIAGNOSIS — D229 Melanocytic nevi, unspecified: Secondary | ICD-10-CM | POA: Diagnosis not present

## 2017-01-25 ENCOUNTER — Other Ambulatory Visit (INDEPENDENT_AMBULATORY_CARE_PROVIDER_SITE_OTHER): Payer: Medicare Other

## 2017-01-25 ENCOUNTER — Encounter: Payer: Self-pay | Admitting: Pulmonary Disease

## 2017-01-25 ENCOUNTER — Ambulatory Visit: Payer: Medicare Other | Admitting: Pulmonary Disease

## 2017-01-25 VITALS — BP 138/90 | HR 71 | Ht 72.0 in | Wt 224.2 lb

## 2017-01-25 DIAGNOSIS — I4510 Unspecified right bundle-branch block: Secondary | ICD-10-CM

## 2017-01-25 DIAGNOSIS — I471 Supraventricular tachycardia, unspecified: Secondary | ICD-10-CM

## 2017-01-25 DIAGNOSIS — E782 Mixed hyperlipidemia: Secondary | ICD-10-CM | POA: Diagnosis not present

## 2017-01-25 DIAGNOSIS — N400 Enlarged prostate without lower urinary tract symptoms: Secondary | ICD-10-CM

## 2017-01-25 DIAGNOSIS — F419 Anxiety disorder, unspecified: Secondary | ICD-10-CM

## 2017-01-25 DIAGNOSIS — M170 Bilateral primary osteoarthritis of knee: Secondary | ICD-10-CM

## 2017-01-25 DIAGNOSIS — D126 Benign neoplasm of colon, unspecified: Secondary | ICD-10-CM

## 2017-01-25 DIAGNOSIS — I10 Essential (primary) hypertension: Secondary | ICD-10-CM | POA: Diagnosis not present

## 2017-01-25 DIAGNOSIS — R9431 Abnormal electrocardiogram [ECG] [EKG]: Secondary | ICD-10-CM

## 2017-01-25 LAB — LIPID PANEL
CHOLESTEROL: 180 mg/dL (ref 0–200)
HDL: 54.9 mg/dL (ref >39.00)
LDL CALC: 94 mg/dL (ref 0–99)
NonHDL: 125.02
Total CHOL/HDL Ratio: 3
Triglycerides: 153 mg/dL — ABNORMAL HIGH (ref 0.0–149.0)
VLDL: 30.6 mg/dL (ref 0.0–40.0)

## 2017-01-25 LAB — CBC WITH DIFFERENTIAL/PLATELET
BASOS PCT: 0.8 % (ref 0.0–3.0)
Basophils Absolute: 0.1 10*3/uL (ref 0.0–0.1)
EOS PCT: 2.6 % (ref 0.0–5.0)
Eosinophils Absolute: 0.2 10*3/uL (ref 0.0–0.7)
HEMATOCRIT: 46.8 % (ref 39.0–52.0)
HEMOGLOBIN: 15.5 g/dL (ref 13.0–17.0)
LYMPHS PCT: 18.8 % (ref 12.0–46.0)
Lymphs Abs: 1.3 10*3/uL (ref 0.7–4.0)
MCHC: 33.2 g/dL (ref 30.0–36.0)
MCV: 93.1 fl (ref 78.0–100.0)
MONOS PCT: 10.7 % (ref 3.0–12.0)
Monocytes Absolute: 0.7 10*3/uL (ref 0.1–1.0)
Neutro Abs: 4.6 10*3/uL (ref 1.4–7.7)
Neutrophils Relative %: 67.1 % (ref 43.0–77.0)
Platelets: 280 10*3/uL (ref 150.0–400.0)
RBC: 5.02 Mil/uL (ref 4.22–5.81)
RDW: 12.9 % (ref 11.5–15.5)
WBC: 6.8 10*3/uL (ref 4.0–10.5)

## 2017-01-25 LAB — COMPREHENSIVE METABOLIC PANEL
ALBUMIN: 4.5 g/dL (ref 3.5–5.2)
ALT: 36 U/L (ref 0–53)
AST: 27 U/L (ref 0–37)
Alkaline Phosphatase: 63 U/L (ref 39–117)
BUN: 9 mg/dL (ref 6–23)
CHLORIDE: 103 meq/L (ref 96–112)
CO2: 32 mEq/L (ref 19–32)
Calcium: 9.5 mg/dL (ref 8.4–10.5)
Creatinine, Ser: 0.91 mg/dL (ref 0.40–1.50)
GFR: 88.46 mL/min (ref >60.00)
Glucose, Bld: 102 mg/dL — ABNORMAL HIGH (ref 70–99)
POTASSIUM: 4.6 meq/L (ref 3.5–5.1)
SODIUM: 141 meq/L (ref 135–145)
Total Bilirubin: 0.7 mg/dL (ref 0.2–1.2)
Total Protein: 7 g/dL (ref 6.0–8.3)

## 2017-01-25 LAB — PSA: PSA: 0.72 ng/mL (ref 0.10–4.00)

## 2017-01-25 LAB — TSH: TSH: 1.85 u[IU]/mL (ref 0.35–4.50)

## 2017-01-25 NOTE — Patient Instructions (Signed)
Today we updated your med list in our EPIC system...    Continue your current medications the same...  We wrote a new prescription for generic Viagra= Sildenafil 20mg  tabs-- take 2-5 tabs as needed...   Today we did your follow up FASTING blood work...    We will contact you w/ the results when available...   Call for any questions...  Let's plan a follow up visit in 7yr, sooner if needed for problems.Marland KitchenMarland Kitchen
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
## 2017-02-06 ENCOUNTER — Ambulatory Visit: Payer: Medicare Other | Admitting: Physician Assistant

## 2017-02-08 ENCOUNTER — Ambulatory Visit: Payer: Medicare Other | Admitting: Physician Assistant

## 2017-04-10 ENCOUNTER — Other Ambulatory Visit: Payer: Self-pay | Admitting: Pulmonary Disease

## 2017-04-10 ENCOUNTER — Ambulatory Visit: Payer: Medicare Other | Admitting: Internal Medicine

## 2017-04-14 ENCOUNTER — Other Ambulatory Visit: Payer: Self-pay | Admitting: *Deleted

## 2017-04-14 MED ORDER — DILTIAZEM HCL ER COATED BEADS 180 MG PO CP24: 180.0000 mg | ORAL_CAPSULE | Freq: Every day | ORAL | 2 refills | Status: DC

## 2017-04-24 ENCOUNTER — Telehealth: Payer: Self-pay | Admitting: Internal Medicine

## 2017-04-24 ENCOUNTER — Telehealth: Payer: Self-pay

## 2017-04-24 MED ORDER — ATORVASTATIN CALCIUM 20 MG PO TABS: 20.0000 mg | ORAL_TABLET | Freq: Every day | ORAL | 3 refills | Status: DC

## 2017-04-24 NOTE — Telephone Encounter (Signed)
Returned call to Pt.  Per Pt Dr. Lenna Gilford started him on his cholesterol med.  Wondered if Dr. Lenna Gilford would mind change.  Notified Pt why we changed his med and notified Dr. Lenna Gilford would see why in my note.  If Dr. Lenna Gilford wants to make further changes that would be fine.  Pt will finish his current prescription simvastatin before changing to atorvastatin.  Med sent to [facility] per Pt.

## 2017-04-24 NOTE — Telephone Encounter (Signed)
Call made to Pt.  [facility] left per DPR.  Notified Pt that Dr. Caryl Comes is making a change in his cholesterol medication to reduce risk of myalgias.  Take simvastatin until new prescription for atorvastatin 20 mg comes via mail pharmacy.  Then stop simvastatin and start atorvastatin 20 mg one tablet by mouth daily.  Left this nurse name and # for call back if any questions.

## 2017-04-24 NOTE — Telephone Encounter (Signed)
New Message   Patient is returning call to nurse in reference to change in medication. Please call to discuss.

## 2017-05-25 DIAGNOSIS — M67441 Ganglion, right hand: Secondary | ICD-10-CM | POA: Diagnosis not present

## 2017-06-05 DIAGNOSIS — Z4802 Encounter for removal of sutures: Secondary | ICD-10-CM | POA: Diagnosis not present

## 2017-10-31 ENCOUNTER — Telehealth: Payer: Self-pay | Admitting: Pulmonary Disease

## 2017-10-31 NOTE — Telephone Encounter (Signed)
Spoke with Levada Dy at pharmacy. Advised her that per patient's chart, he is to take Viagra 100mg  1 tablet as needed with dispense quantity of 18. She stated that the patient had a handwritten RX from 2018 that had a dispense quantity of 10. Advised her that the 10 would be ok. She verbalized understanding.   Nothing further needed at time of call.

## 2017-12-19 DIAGNOSIS — Z23 Encounter for immunization: Secondary | ICD-10-CM | POA: Diagnosis not present

## 2017-12-31 ENCOUNTER — Other Ambulatory Visit: Payer: Self-pay | Admitting: Internal Medicine

## 2018-01-25 ENCOUNTER — Encounter: Payer: Self-pay | Admitting: Pulmonary Disease

## 2018-01-25 ENCOUNTER — Ambulatory Visit: Payer: Medicare Other | Admitting: Pulmonary Disease

## 2018-01-25 ENCOUNTER — Other Ambulatory Visit (INDEPENDENT_AMBULATORY_CARE_PROVIDER_SITE_OTHER): Payer: Medicare Other

## 2018-01-25 ENCOUNTER — Ambulatory Visit (INDEPENDENT_AMBULATORY_CARE_PROVIDER_SITE_OTHER)
Admission: RE | Admit: 2018-01-25 | Discharge: 2018-01-25 | Disposition: A | Discharge status: Home / Self Care | Payer: Medicare Other | Source: Ambulatory Visit | Attending: Pulmonary Disease | Admitting: Pulmonary Disease

## 2018-01-25 VITALS — BP 142/86 | HR 73 | Temp 98.0°F | Ht 72.0 in | Wt 225.6 lb

## 2018-01-25 DIAGNOSIS — E782 Mixed hyperlipidemia: Secondary | ICD-10-CM | POA: Diagnosis not present

## 2018-01-25 DIAGNOSIS — Z0000 Encounter for general adult medical examination without abnormal findings: Secondary | ICD-10-CM

## 2018-01-25 DIAGNOSIS — N138 Other obstructive and reflux uropathy: Secondary | ICD-10-CM | POA: Diagnosis not present

## 2018-01-25 DIAGNOSIS — N401 Enlarged prostate with lower urinary tract symptoms: Secondary | ICD-10-CM

## 2018-01-25 DIAGNOSIS — F419 Anxiety disorder, unspecified: Secondary | ICD-10-CM | POA: Diagnosis not present

## 2018-01-25 DIAGNOSIS — I471 Supraventricular tachycardia: Secondary | ICD-10-CM

## 2018-01-25 DIAGNOSIS — I10 Essential (primary) hypertension: Secondary | ICD-10-CM

## 2018-01-25 DIAGNOSIS — R9431 Abnormal electrocardiogram [ECG] [EKG]: Secondary | ICD-10-CM

## 2018-01-25 DIAGNOSIS — M170 Bilateral primary osteoarthritis of knee: Secondary | ICD-10-CM

## 2018-01-25 DIAGNOSIS — I4510 Unspecified right bundle-branch block: Secondary | ICD-10-CM

## 2018-01-25 DIAGNOSIS — Z87891 Personal history of nicotine dependence: Secondary | ICD-10-CM | POA: Diagnosis not present

## 2018-01-25 LAB — COMPREHENSIVE METABOLIC PANEL
ALK PHOS: 76 U/L (ref 39–117)
ALT: 32 U/L (ref 0–53)
AST: 26 U/L (ref 0–37)
Albumin: 4.8 g/dL (ref 3.5–5.2)
BILIRUBIN TOTAL: 1 mg/dL (ref 0.2–1.2)
BUN: 10 mg/dL (ref 6–23)
CO2: 28 mEq/L (ref 19–32)
CREATININE: 0.85 mg/dL (ref 0.40–1.50)
Calcium: 9.5 mg/dL (ref 8.4–10.5)
Chloride: 103 mEq/L (ref 96–112)
GFR: 95.42 mL/min (ref >60.00)
GLUCOSE: 102 mg/dL — AB (ref 70–99)
Potassium: 4.5 mEq/L (ref 3.5–5.1)
SODIUM: 141 meq/L (ref 135–145)
TOTAL PROTEIN: 7.4 g/dL (ref 6.0–8.3)

## 2018-01-25 LAB — CBC WITH DIFFERENTIAL/PLATELET
Basophils Absolute: 0 10*3/uL (ref 0.0–0.1)
Basophils Relative: 0.6 % (ref 0.0–3.0)
EOS PCT: 1.9 % (ref 0.0–5.0)
Eosinophils Absolute: 0.1 10*3/uL (ref 0.0–0.7)
HCT: 46 % (ref 39.0–52.0)
Hemoglobin: 15.5 g/dL (ref 13.0–17.0)
LYMPHS ABS: 1.3 10*3/uL (ref 0.7–4.0)
Lymphocytes Relative: 17.7 % (ref 12.0–46.0)
MCHC: 33.6 g/dL (ref 30.0–36.0)
MCV: 92.7 fl (ref 78.0–100.0)
MONO ABS: 0.8 10*3/uL (ref 0.1–1.0)
Monocytes Relative: 11.1 % (ref 3.0–12.0)
NEUTROS ABS: 4.9 10*3/uL (ref 1.4–7.7)
NEUTROS PCT: 68.7 % (ref 43.0–77.0)
PLATELETS: 263 10*3/uL (ref 150.0–400.0)
RBC: 4.96 Mil/uL (ref 4.22–5.81)
RDW: 12.8 % (ref 11.5–15.5)
WBC: 7.1 10*3/uL (ref 4.0–10.5)

## 2018-01-25 LAB — LIPID PANEL
CHOLESTEROL: 155 mg/dL (ref 0–200)
HDL: 54 mg/dL (ref >39.00)
LDL Cholesterol: 70 mg/dL (ref 0–99)
NonHDL: 100.71
Total CHOL/HDL Ratio: 3
Triglycerides: 152 mg/dL — ABNORMAL HIGH (ref 0.0–149.0)
VLDL: 30.4 mg/dL (ref 0.0–40.0)

## 2018-01-25 LAB — PSA: PSA: 0.69 ng/mL (ref 0.10–4.00)

## 2018-01-25 LAB — TSH: TSH: 2.1 u[IU]/mL (ref 0.35–4.50)

## 2018-01-25 MED ORDER — SILDENAFIL CITRATE 100 MG PO TABS: 100.0000 mg | ORAL_TABLET | ORAL | 5 refills | Status: DC | PRN

## 2018-01-25 NOTE — Patient Instructions (Signed)
Today we updated your med list in our EPIC system...    Continue your current medications the same...  Today we did your follow up CXR & FASTING blood work...    We will contact you w/ the results when available...   Stay as active as possible & work on weight reduction...  We discussed my up-coming retirement & the need to obtain another primary care provider...  [name],  It has been my honor to have been your doctor over these many years...    Sending you my very best wishes for good health & much happiness in the future!!!

## 2018-01-26 ENCOUNTER — Encounter: Payer: Self-pay | Admitting: Pulmonary Disease

## 2018-01-26 ENCOUNTER — Ambulatory Visit (INDEPENDENT_AMBULATORY_CARE_PROVIDER_SITE_OTHER): Payer: Medicare Other | Admitting: Internal Medicine

## 2018-01-26 ENCOUNTER — Encounter: Payer: Self-pay | Admitting: Internal Medicine

## 2018-01-26 VITALS — BP 152/76 | HR 79 | Ht 72.0 in | Wt 226.6 lb

## 2018-01-26 DIAGNOSIS — I779 Disorder of arteries and arterioles, unspecified: Secondary | ICD-10-CM

## 2018-01-26 DIAGNOSIS — I10 Essential (primary) hypertension: Secondary | ICD-10-CM | POA: Diagnosis not present

## 2018-01-26 DIAGNOSIS — I471 Supraventricular tachycardia: Secondary | ICD-10-CM | POA: Diagnosis not present

## 2018-01-26 MED ORDER — DILTIAZEM HCL ER COATED BEADS 180 MG PO CP24: 180.0000 mg | ORAL_CAPSULE | Freq: Every day | ORAL | 3 refills | Status: DC

## 2018-01-26 NOTE — Patient Instructions (Addendum)
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
## 2018-02-03 IMAGING — DX DG CHEST 2V
2 series · 2 of 2 positions shown · non-contrast
Comparison: 01/26/2015

CLINICAL DATA: Routine. No complaints. Hx of HTN. Ex smoker.

EXAM:
CHEST - 2 VIEW

[chest pa]
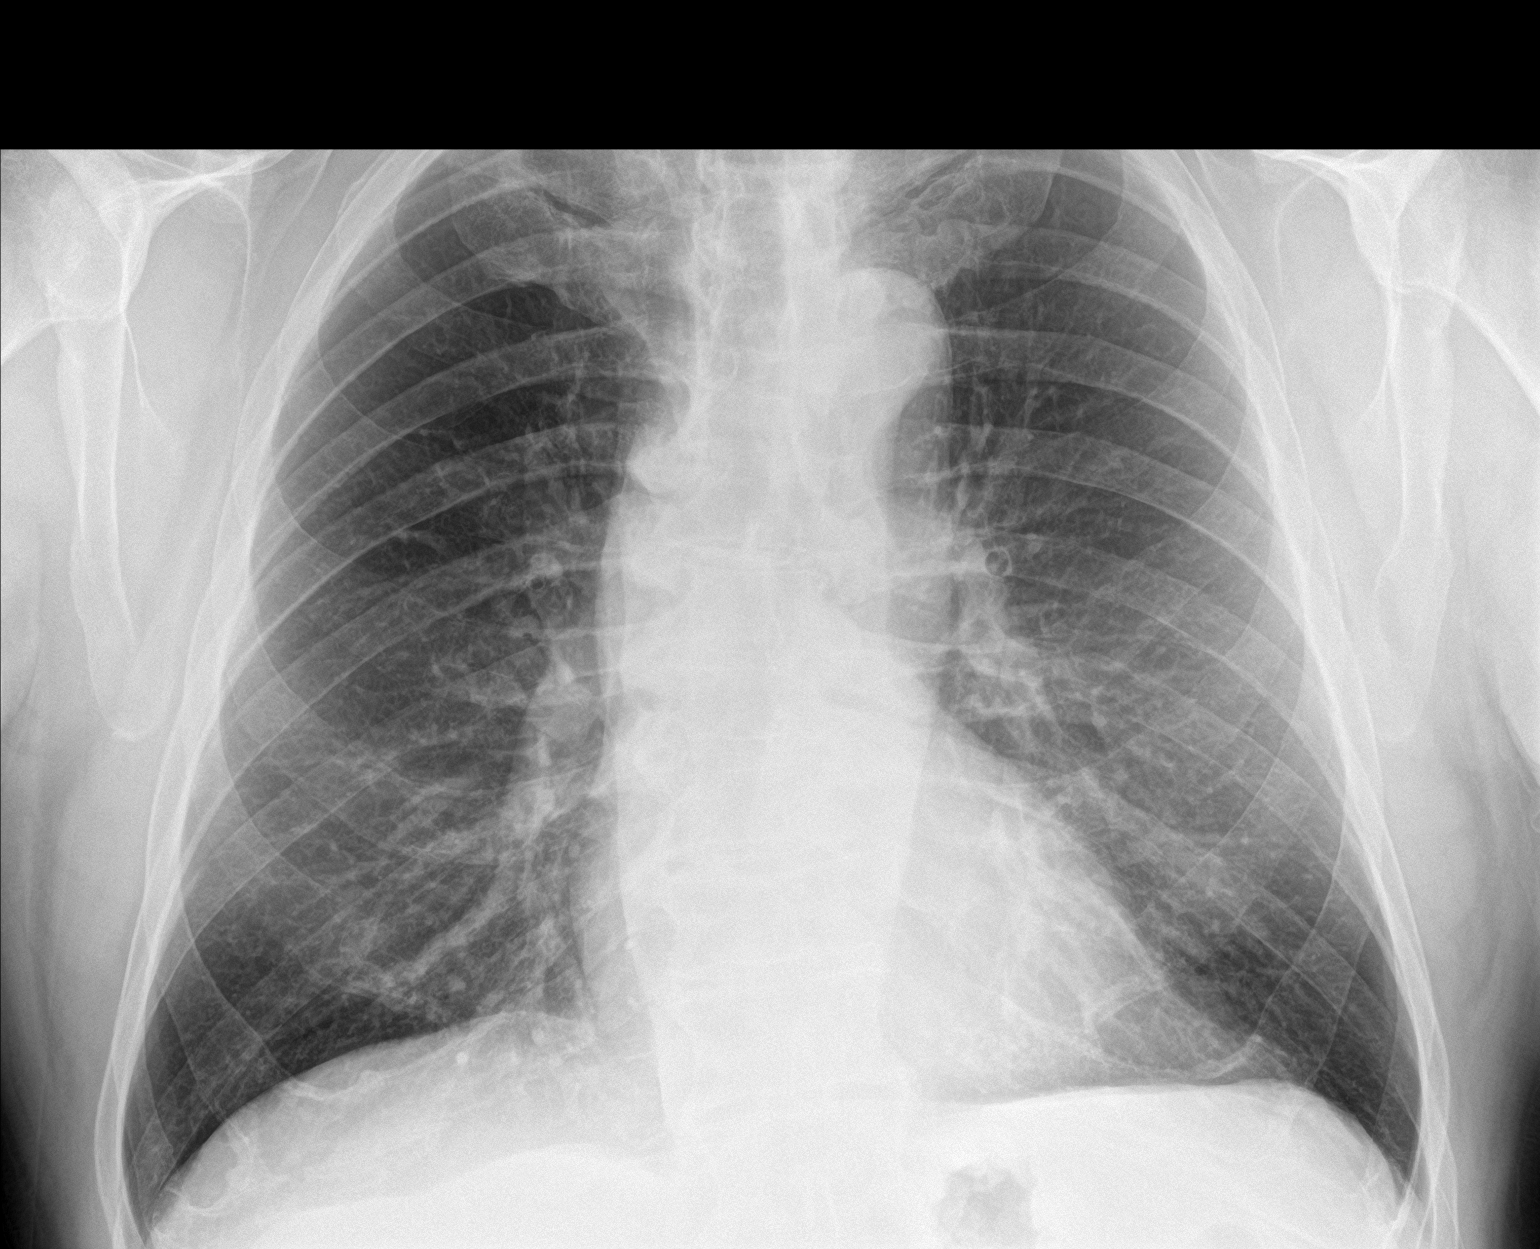

[chest lat]
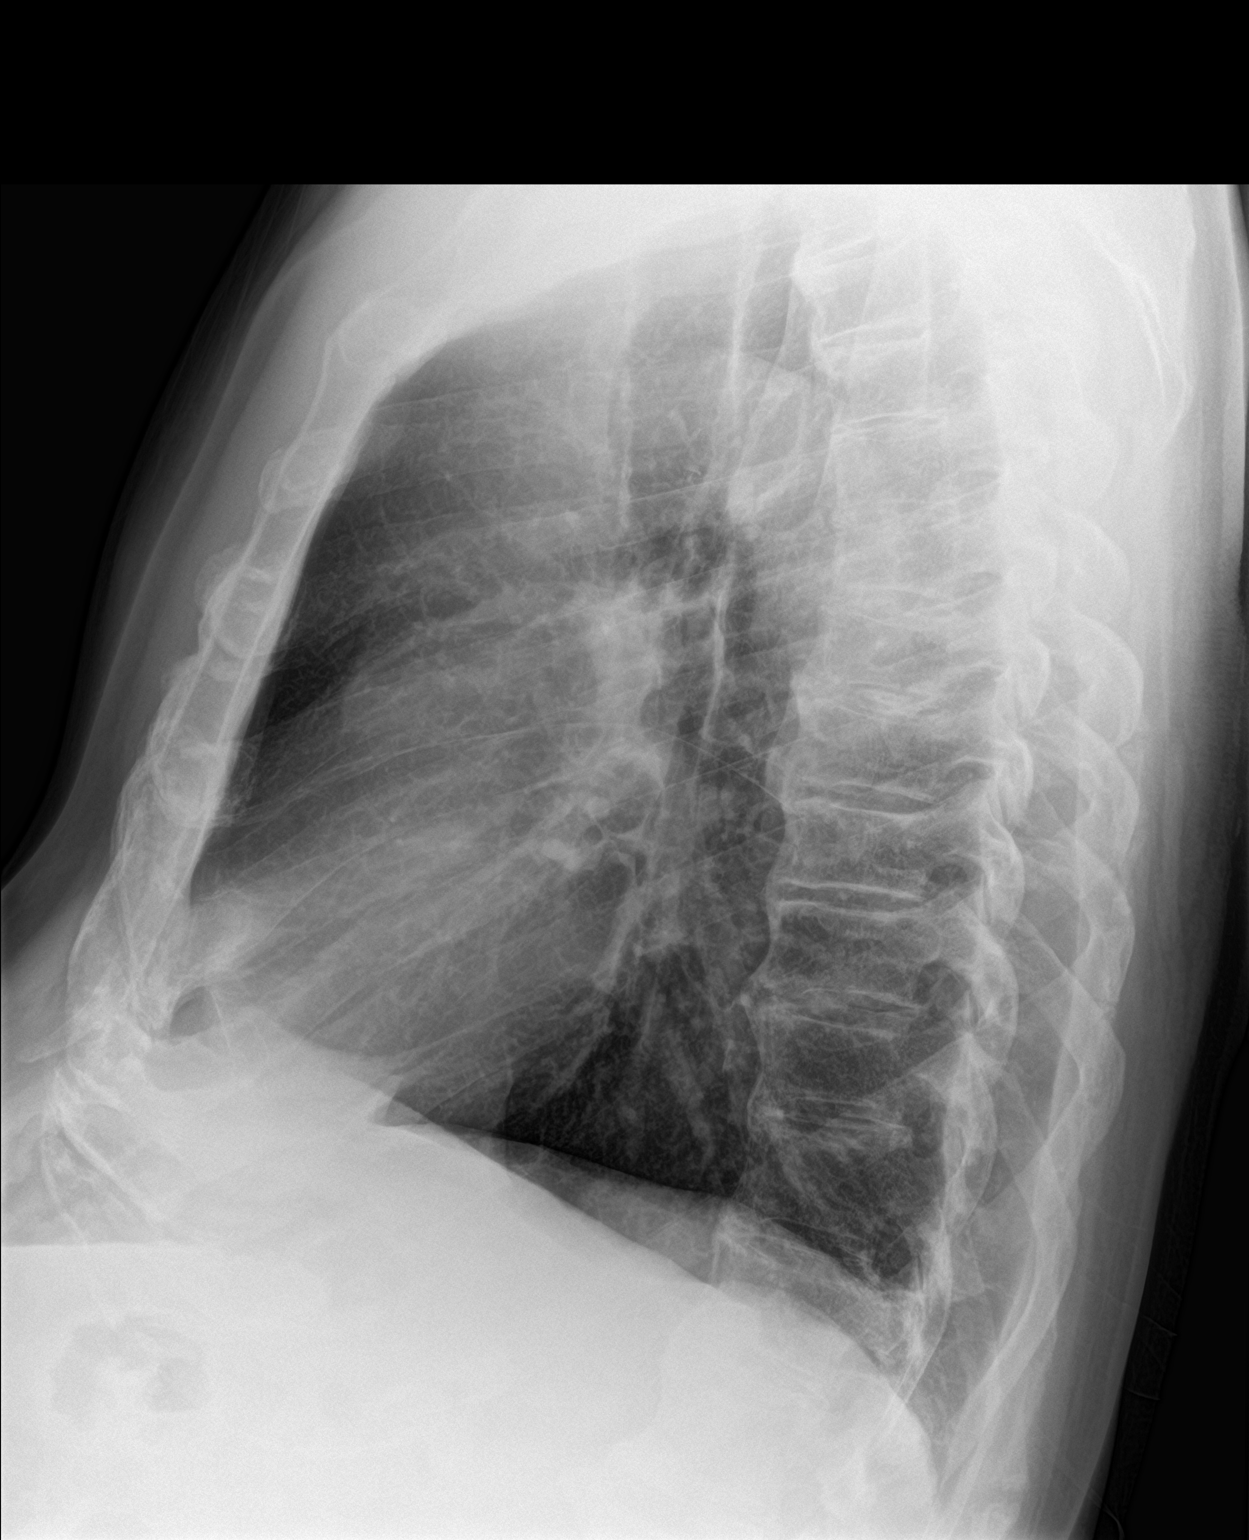

[2 of 2 positions shown; findings below may reference images not displayed]

FINDINGS: Lungs clear, hyperinflated.

Heart size normal.  Tortuous aorta.

No effusion.

Anterior vertebral endplate spurring at multiple levels in the mid
and lower thoracic spine.
IMPRESSION: Hyperinflation without acute or superimposed abnormality.

## 2018-02-12 ENCOUNTER — Encounter: Payer: Medicare Other | Admitting: Family Medicine

## 2018-02-22 ENCOUNTER — Encounter: Payer: Self-pay | Admitting: Family Medicine

## 2018-02-22 ENCOUNTER — Ambulatory Visit (INDEPENDENT_AMBULATORY_CARE_PROVIDER_SITE_OTHER): Payer: Medicare Other | Admitting: Family Medicine

## 2018-02-22 VITALS — BP 126/70 | HR 74 | Temp 98.3°F | Ht 72.0 in | Wt 226.0 lb

## 2018-02-22 DIAGNOSIS — H9193 Unspecified hearing loss, bilateral: Secondary | ICD-10-CM | POA: Diagnosis not present

## 2018-02-22 DIAGNOSIS — N529 Male erectile dysfunction, unspecified: Secondary | ICD-10-CM

## 2018-02-22 NOTE — Patient Instructions (Addendum)
Let me know if you need refills.  Keep the diet clean and stay active.  Let us know if you need anything.
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
## 2018-02-22 NOTE — Progress Notes (Signed)
Pre visit review using our clinic review tool, if applicable. No additional management support is needed unless otherwise documented below in the visit note. 

## 2018-03-01 DIAGNOSIS — M1711 Unilateral primary osteoarthritis, right knee: Secondary | ICD-10-CM | POA: Diagnosis not present

## 2018-03-08 DIAGNOSIS — M1711 Unilateral primary osteoarthritis, right knee: Secondary | ICD-10-CM | POA: Diagnosis not present

## 2018-03-12 ENCOUNTER — Telehealth: Payer: Self-pay | Admitting: Internal Medicine

## 2018-03-12 NOTE — Telephone Encounter (Signed)
New Message   Pt c/o BP issue:  1. What are your last 5 BP readings? 150/93  142/84  148/88   151/90    151/86 2. Are you having any other symptoms (ex. Dizziness, headache, blurred vision, passed out)? No  3. What is your medication issue? none

## 2018-03-12 NOTE — Telephone Encounter (Signed)
Pt calling today to report on his BP's for the last month. He states his SBP has been ranging in the 140's-150's range. I advised pt per Dr Olin Pia last OV note, if pt's BP remained elevated, pt should follow up with his PCP for BP management. Pt has verbalized understanding and agreed with plan. He had no additional questions.

## 2018-03-13 DIAGNOSIS — M1711 Unilateral primary osteoarthritis, right knee: Secondary | ICD-10-CM | POA: Diagnosis not present

## 2018-03-15 ENCOUNTER — Telehealth: Payer: Self-pay | Admitting: Family Medicine

## 2018-03-15 ENCOUNTER — Encounter: Payer: Medicare Other | Admitting: Family Medicine

## 2018-03-15 NOTE — Telephone Encounter (Signed)
This is not controlled. Have him schedule an appt or he can follow up with the cardiology team. Make sure he knows this is not emergent. TY.

## 2018-03-15 NOTE — Telephone Encounter (Signed)
Copied from [place] [id number]. Topic: Quick Communication - See Telephone Encounter >> Mar 15, 2018 11:11 AM Vernona Rieger wrote: CRM for notification. See Telephone encounter for: 03/15/18.  Patient called and stated that his cardiologist Dr Caryl Comes wanted him to keep a check on his blood pressure readings for the last month. He called over to his office and they advised him that Dr Caryl Comes was out of the office and to call his primary care doctor. He said that his readings are running anywhere from 140-150 top number and bottom number in the 90's. He would like to speak with a nurse.

## 2018-03-15 NOTE — Telephone Encounter (Signed)
Spoke to the patient informed of PCP instructions. Scheduled an appt with PCP on 03/23/18 at 1 PM

## 2018-03-23 ENCOUNTER — Ambulatory Visit: Payer: Medicare Other | Admitting: Family Medicine

## 2018-03-23 ENCOUNTER — Encounter: Payer: Medicare Other | Admitting: Family Medicine

## 2018-03-29 DIAGNOSIS — M1712 Unilateral primary osteoarthritis, left knee: Secondary | ICD-10-CM | POA: Diagnosis not present

## 2018-04-05 DIAGNOSIS — M1712 Unilateral primary osteoarthritis, left knee: Secondary | ICD-10-CM | POA: Diagnosis not present

## 2018-04-09 ENCOUNTER — Other Ambulatory Visit: Payer: Self-pay | Admitting: Internal Medicine

## 2018-04-09 MED ORDER — DILTIAZEM HCL ER COATED BEADS 180 MG PO CP24: 180.0000 mg | ORAL_CAPSULE | Freq: Every day | ORAL | 3 refills | Status: DC

## 2018-04-09 NOTE — Telephone Encounter (Signed)
Pt's medication was sent to pt's pharmacy as requested. Confirmation received.  °

## 2018-04-12 DIAGNOSIS — M1712 Unilateral primary osteoarthritis, left knee: Secondary | ICD-10-CM | POA: Diagnosis not present

## 2018-04-20 ENCOUNTER — Ambulatory Visit: Payer: Medicare Other | Admitting: Family Medicine

## 2018-07-16 ENCOUNTER — Other Ambulatory Visit: Payer: Self-pay | Admitting: Pulmonary Disease

## 2018-07-27 ENCOUNTER — Other Ambulatory Visit: Payer: Self-pay | Admitting: Internal Medicine

## 2018-07-27 MED ORDER — ATORVASTATIN CALCIUM 20 MG PO TABS: 20.0000 mg | ORAL_TABLET | Freq: Every day | ORAL | 1 refills | Status: DC

## 2018-07-27 NOTE — Telephone Encounter (Signed)
Pt's medication was sent to pt's pharmacy as requested. Confirmation received.  °

## 2018-07-31 MED ORDER — ATORVASTATIN CALCIUM 20 MG PO TABS: 20.0000 mg | ORAL_TABLET | Freq: Every day | ORAL | 1 refills | Status: DC

## 2018-07-31 NOTE — Addendum Note (Signed)
Addended by: Derl Barrow on: 07/31/2018 10:55 AM   Modules accepted: Orders

## 2018-10-01 ENCOUNTER — Telehealth: Payer: Self-pay | Admitting: Family Medicine

## 2018-10-01 NOTE — Telephone Encounter (Signed)
OK w me.  

## 2018-10-01 NOTE — Telephone Encounter (Signed)
Unfortunately I am unable to accept at this time.  thanks

## 2018-10-01 NOTE — Telephone Encounter (Signed)
Patient would like to transfer from [place] to [place] because of location please advise

## 2018-10-04 NOTE — Telephone Encounter (Signed)
LVM for patient informing of info below

## 2018-12-25 DIAGNOSIS — Z23 Encounter for immunization: Secondary | ICD-10-CM | POA: Diagnosis not present

## 2019-01-09 ENCOUNTER — Other Ambulatory Visit: Payer: Self-pay | Admitting: Internal Medicine

## 2019-01-09 DIAGNOSIS — I471 Supraventricular tachycardia: Secondary | ICD-10-CM | POA: Diagnosis not present

## 2019-01-09 DIAGNOSIS — Z1211 Encounter for screening for malignant neoplasm of colon: Secondary | ICD-10-CM | POA: Diagnosis not present

## 2019-01-25 ENCOUNTER — Other Ambulatory Visit: Payer: Self-pay

## 2019-01-25 DIAGNOSIS — M1712 Unilateral primary osteoarthritis, left knee: Secondary | ICD-10-CM | POA: Diagnosis not present

## 2019-01-25 DIAGNOSIS — M170 Bilateral primary osteoarthritis of knee: Secondary | ICD-10-CM | POA: Diagnosis not present

## 2019-01-25 DIAGNOSIS — M1711 Unilateral primary osteoarthritis, right knee: Secondary | ICD-10-CM | POA: Diagnosis not present

## 2019-01-28 ENCOUNTER — Other Ambulatory Visit: Payer: Self-pay

## 2019-01-28 ENCOUNTER — Encounter: Payer: Self-pay | Admitting: Internal Medicine

## 2019-01-28 ENCOUNTER — Ambulatory Visit (INDEPENDENT_AMBULATORY_CARE_PROVIDER_SITE_OTHER): Payer: Medicare Other | Admitting: Internal Medicine

## 2019-01-28 ENCOUNTER — Ambulatory Visit (INDEPENDENT_AMBULATORY_CARE_PROVIDER_SITE_OTHER): Payer: Medicare Other | Admitting: Family Medicine

## 2019-01-28 ENCOUNTER — Encounter: Payer: Self-pay | Admitting: Family Medicine

## 2019-01-28 VITALS — BP 122/82 | HR 70 | Temp 97.6°F | Ht 72.0 in | Wt 229.4 lb

## 2019-01-28 VITALS — BP 140/78 | HR 63 | Ht 72.0 in | Wt 233.0 lb

## 2019-01-28 DIAGNOSIS — I471 Supraventricular tachycardia: Secondary | ICD-10-CM | POA: Diagnosis not present

## 2019-01-28 DIAGNOSIS — Z125 Encounter for screening for malignant neoplasm of prostate: Secondary | ICD-10-CM | POA: Diagnosis not present

## 2019-01-28 DIAGNOSIS — I4510 Unspecified right bundle-branch block: Secondary | ICD-10-CM | POA: Diagnosis not present

## 2019-01-28 DIAGNOSIS — E782 Mixed hyperlipidemia: Secondary | ICD-10-CM | POA: Diagnosis not present

## 2019-01-28 DIAGNOSIS — Z0000 Encounter for general adult medical examination without abnormal findings: Secondary | ICD-10-CM | POA: Diagnosis not present

## 2019-01-28 LAB — COMPREHENSIVE METABOLIC PANEL
ALT: 52 U/L (ref 0–53)
AST: 36 U/L (ref 0–37)
Albumin: 4.4 g/dL (ref 3.5–5.2)
Alkaline Phosphatase: 82 U/L (ref 39–117)
BUN: 10 mg/dL (ref 6–23)
CO2: 30 mEq/L (ref 19–32)
Calcium: 9 mg/dL (ref 8.4–10.5)
Chloride: 104 mEq/L (ref 96–112)
Creatinine, Ser: 0.96 mg/dL (ref 0.40–1.50)
GFR: 77.78 mL/min (ref >60.00)
Glucose, Bld: 101 mg/dL — ABNORMAL HIGH (ref 70–99)
Potassium: 4.7 mEq/L (ref 3.5–5.1)
Sodium: 143 mEq/L (ref 135–145)
Total Bilirubin: 0.5 mg/dL (ref 0.2–1.2)
Total Protein: 6.9 g/dL (ref 6.0–8.3)

## 2019-01-28 LAB — LIPID PANEL
Cholesterol: 159 mg/dL (ref 0–200)
HDL: 43.8 mg/dL (ref >39.00)
LDL Cholesterol: 78 mg/dL (ref 0–99)
NonHDL: 114.7
Total CHOL/HDL Ratio: 4
Triglycerides: 185 mg/dL — ABNORMAL HIGH (ref 0.0–149.0)
VLDL: 37 mg/dL (ref 0.0–40.0)

## 2019-01-28 LAB — PSA, MEDICARE: PSA: 0.61 ng/ml (ref 0.10–4.00)

## 2019-01-28 MED ORDER — SILDENAFIL CITRATE 100 MG PO TABS: 100.0000 mg | ORAL_TABLET | ORAL | 5 refills | Status: DC | PRN

## 2019-01-28 MED ORDER — LOSARTAN POTASSIUM 25 MG PO TABS: 25.0000 mg | ORAL_TABLET | Freq: Every day | ORAL | 3 refills | Status: DC

## 2019-01-28 NOTE — Patient Instructions (Addendum)
Medication Instructions:   Begin Losartan, 25mg  tablet, once daily   Labwork: BMP in 2 weeks  Testing/Procedures: None ordered.  Follow-Up: Your physician recommends that you schedule a follow-up appointment in:   6 months with Dr Caryl Comes  Nov 30th for a blood work  Any Other Special Instructions Will Be Listed Below (If Applicable).     If you need a refill on your cardiac medications before your next appointment, please call your pharmacy.

## 2019-01-28 NOTE — Patient Instructions (Signed)
Give us 2-3 business days to get the results of your labs back.   Keep the diet clean and stay active.  Let us know if you need anything. 

## 2019-01-28 NOTE — Progress Notes (Signed)
Subjective:    [name] is a 68 y.o. male who presents for Medicare Annual/Subsequent preventive examination.   Preventive Screening-Counseling & Management  Tobacco Social History   Tobacco Use  Smoking Status Former Smoker  . Packs/day: 1.00  . Years: 30.00  . Pack years: 30.00  . Types: Cigarettes  . Quit date: 03/14/1996  . Years since quitting: 22.8  Smokeless Tobacco Never Used    Problems Prior to Visit 1. ED 2. OA of knees 3. BPH 4. Heart issues, follows with Dr. Caryl Comes  Current Problems (verified) Patient Active Problem List   Diagnosis Date Noted  . Erectile dysfunction 02/22/2018  . Bilateral hearing loss 02/22/2018  . BPH with urinary obstruction 01/25/2018  . Anxiety 01/25/2018  . Osteoarthritis of both knees 01/23/2014  . Physical exam, annual 01/23/2013  . Alopecia 12/26/2012  . 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 COLONIC POLYPS 01/31/2010  . SVT/ PSVT/ PAT 06/21/2008  . Hyperlipidemia, mixed 01/14/2008  . ALCOHOL ABUSE 01/14/2008  . Essential hypertension 01/14/2008  . RBBB 01/14/2008    Medications Prior to Visit Current Outpatient Medications on File Prior to Visit  Medication Sig Dispense Refill  . atorvastatin (LIPITOR) 20 MG tablet Take 1 tablet (20 mg total) by mouth daily. Please keep upcoming appt in November with Dr. Caryl Comes for future refills. Thank you 90 tablet 0  . diltiazem (CARDIZEM CD) 180 MG 24 hr capsule Take 1 capsule (180 mg total) by mouth daily. 90 capsule 3  . Multiple Vitamin (MULTIVITAMIN) tablet Take 1 tablet by mouth daily.      . sildenafil (VIAGRA) 100 MG tablet Take 1 tablet (100 mg total) by mouth as needed for erectile dysfunction. 18 tablet 5   Current Medications (verified) Current Outpatient Medications  Medication Sig Dispense Refill  . atorvastatin (LIPITOR) 20 MG tablet Take 1 tablet (20 mg total) by mouth daily. Please keep upcoming appt in November with Dr. Caryl Comes for future refills. Thank you 90 tablet 0  .  diltiazem (CARDIZEM CD) 180 MG 24 hr capsule Take 1 capsule (180 mg total) by mouth daily. 90 capsule 3  . Multiple Vitamin (MULTIVITAMIN) tablet Take 1 tablet by mouth daily.      . sildenafil (VIAGRA) 100 MG tablet Take 1 tablet (100 mg total) by mouth as needed for erectile dysfunction. 18 tablet 5    Allergies (verified) Patient has no known allergies.   PAST HISTORY  Family History Family History  Problem Relation Age of Onset  . Hypertension Father        hea  . Supraventricular tachycardia Brother    Are there smokers in your home (other than you)?  No  Risk Factors Current exercise habits: healthy overall  Dietary issues discussed: Yes   Cardiac risk factors: advanced age (older than 10 for men, 63 for women), dyslipidemia, male gender and obesity (BMI >= 30 kg/m2).  Depression Screen (Note: if answer to either of the following is "Yes", a more complete depression screening is indicated)   Q1: Over the past two weeks, have you felt down, depressed or hopeless? No  Q2: Over the past two weeks, have you felt little interest or pleasure in doing things? No  Have you lost interest or pleasure in daily life? No  Do you often feel hopeless? No  Do you cry easily over simple problems? No  Activities of Daily Living In your present state of health, do you have any difficulty performing the following activities?:  Driving? No  Managing money?  No Feeding yourself? No Getting from bed to chair? No Climbing a flight of stairs? No Preparing food and eating?: No Bathing or showering? No Getting dressed: No Getting to the toilet? No Using the toilet:No Moving around from place to place: No In the past year have you fallen or had a near fall?:No  Are you sexually active?  Yes  Do you have more than one partner?  No  Hearing Difficulties: Yes Do you often ask people to speak up or repeat themselves? Yes Do you experience ringing or noises in your ears? No Do you have  difficulty understanding soft or whispered voices? Yes   Do you feel that you have a problem with memory? No  Do you often misplace items? No  Do you feel safe at home?  Yes  Cognitive Testing  Alert? Yes  Normal Appearance?Yes  Oriented to person? Yes    Place? Yes   Time? Yes  Recall of three objects?  Yes  Can perform simple calculations? Yes  Displays appropriate judgment?Yes  Can read the correct time from a watch face?Yes   Advanced Directives have been discussed with the patient? Yes   List the Names of Other Physician/Practitioners you currently use: 1.  Dr Olin Pia 2. Dr Edger House  Indicate any recent Medical Services you may have received from other than [facility] providers in the past year (date may be approximate).  Immunization History  Administered Date(s) Administered  . Influenza Split 12/27/2010, 12/19/2011, 12/21/2013  . Influenza Whole 02/18/2004, 12/22/2009  . Influenza, High Dose Seasonal PF 12/26/2016, 12/19/2017  . Influenza,inj,Quad PF,6+ Mos 12/26/2012, 12/22/2015  . Influenza-Unspecified 12/26/2014  . Pneumococcal Conjugate-13 01/23/2014  . Pneumococcal Polysaccharide-23 01/26/2016  . Tdap 01/20/2012  . Zoster 12/23/2012    Screening Tests Health Maintenance  Topic Date Due  . COLONOSCOPY  08/13/2008  . TETANUS/TDAP  01/19/2022  . INFLUENZA VACCINE  Completed  . Hepatitis C Screening  Completed  . PNA vac Low Risk Adult  Completed    All answers were reviewed with the patient and necessary referrals were made:  Shelda Pal, DO   01/28/2019   History reviewed: allergies, current medications, past family history, past medical history, past social history, past surgical history and problem list  Review of Systems A comprehensive review of systems was negative w exception of L knee pain   Objective:     Vision by Snellen chart: right eye:20/20, left eye:20/25 Blood pressure 122/82, pulse 70, temperature 97.6 F (36.4 C),  temperature source Temporal, height 6' (1.829 m), weight 229 lb 6 oz (104 kg), SpO2 96 %. Body mass index is 31.11 kg/m.  BP 122/82 (BP Location: Left Arm, Patient Position: Sitting, Cuff Size: Large)   Pulse 70   Temp 97.6 F (36.4 C) (Temporal)   Ht 6' (1.829 m)   Wt 229 lb 6 oz (104 kg)   SpO2 96%   BMI 31.11 kg/m   General Appearance:    Alert, cooperative, no distress, appears stated age  Head:    Normocephalic, without obvious abnormality, atraumatic  Eyes:    PERRL, conjunctiva/corneas clear, EOM's intact, fundi    benign, both eyes       Ears:    Normal TM's and external ear canals, both ears  Nose:   Nares normal, septum midline, mucosa normal, no drainage    or sinus tenderness  Throat:   Lips, mucosa, and tongue normal; teeth and gums normal  Neck:  Supple, symmetrical, trachea midline, no adenopathy;       thyroid:  No enlargement/tenderness/nodules; no carotid   bruit or JVD  Back:     Symmetric, no curvature, ROM normal, no CVA tenderness  Lungs:     Clear to auscultation bilaterally, respirations unlabored  Chest wall:    No tenderness or deformity  Heart:    Regular rate and rhythm, S1 and S2 normal, no murmur, rub   or gallop  Abdomen:     Soft, non-tender, bowel sounds active all four quadrants,    no masses, no organomegaly  Extremities:   Extremities normal, atraumatic, no cyanosis or edema  Pulses:   2+ and symmetric all extremities  Skin:   Skin color, texture, turgor normal, no rashes or lesions  Lymph nodes:   Cervical, supraclavicular, and axillary nodes normal  Neurologic:   CNII-XII intact. Normal strength, sensation and reflexes      throughout       Assessment:   Medicare annual wellness visit, subsequent  Screening for malignant neoplasm of prostate - Plan: PSA, Medicare ( [place] only)  Hyperlipidemia, mixed - Plan: Lipid Profile, Comp Met (CMET)    Plan:     During the course of the visit the patient was educated and  counseled about appropriate screening and preventive services including:    Prostate cancer screening; counseled on risks and benefits of screening, agreed to undergo testing. After next year, will stop.   needs colonoscopy, scheduled for next week  Diet review for nutrition referral?   Not Indicated   Patient Instructions (the written plan) was given to the patient.  Medicare Attestation I have personally reviewed: The patient's medical and social history Their use of alcohol, tobacco or illicit drugs Their current medications and supplements The patient's functional ability including ADLs,fall risks, home safety risks, cognitive, and hearing and visual impairment Diet and physical activities Evidence for depression or mood disorders  The patient's weight, height, BMI, and visual acuity have been recorded in the chart.  I have made referrals, counseling, and provided education to the patient based on review of the above and I have provided the patient with a written personalized care plan for preventive services.     [place], DO   01/28/2019
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
## 2019-01-30 ENCOUNTER — Telehealth: Payer: Self-pay | Admitting: *Deleted

## 2019-01-30 ENCOUNTER — Other Ambulatory Visit: Payer: Self-pay | Admitting: Family Medicine

## 2019-01-30 DIAGNOSIS — R739 Hyperglycemia, unspecified: Secondary | ICD-10-CM

## 2019-01-30 DIAGNOSIS — Z01818 Encounter for other preprocedural examination: Secondary | ICD-10-CM

## 2019-01-30 DIAGNOSIS — E782 Mixed hyperlipidemia: Secondary | ICD-10-CM

## 2019-01-30 NOTE — Telephone Encounter (Signed)
   Primary Cardiologist: Virl Axe, MD  Chart reviewed as part of pre-operative protocol coverage. Given past medical history and time since last visit, based on ACC/AHA guidelines, Jordy Foltyn would be at acceptable risk for the planned procedure without further cardiovascular testing.   Mr. Trotta was recently seen by Dr. Caryl Comes in follow up for SVT and HTN. He was doing well from a cardiac perspective. BP was noted to be a little elevated and therefore his losartan was increased. Has follow up lab work on 02/11/19.   I will route this recommendation to the requesting party via Epic fax function and remove from pre-op pool.  Please call with questions.  Kathyrn Drown, NP 01/30/2019, 12:01 PM

## 2019-01-30 NOTE — Telephone Encounter (Signed)
   [facility] Pre-operative Risk Assessment    Request for surgical clearance:  1. What type of surgery is being performed? LEFT TOTAL KNEE ARTHROPLASTY   2. When is this surgery scheduled? 04/08/19   3. What type of clearance is required (medical clearance vs. Pharmacy clearance to hold med vs. Both)? MEDICAL  4. Are there any medications that need to be held prior to surgery and how long? NONE LISTED    5. Practice name and name of physician performing surgery? [facility]; DR. FRANK ALUISIO   6. What is your office phone number [phone]    7.   What is your office fax number [phone]  8.   Anesthesia type (None, local, MAC, general) ? CHOICE   [name] 01/30/2019, 11:40 AM  _________________________________________________________________   (provider comments below)

## 2019-01-31 ENCOUNTER — Other Ambulatory Visit (INDEPENDENT_AMBULATORY_CARE_PROVIDER_SITE_OTHER): Payer: Medicare Other

## 2019-01-31 DIAGNOSIS — Z01818 Encounter for other preprocedural examination: Secondary | ICD-10-CM | POA: Diagnosis not present

## 2019-01-31 DIAGNOSIS — E782 Mixed hyperlipidemia: Secondary | ICD-10-CM | POA: Diagnosis not present

## 2019-01-31 DIAGNOSIS — R739 Hyperglycemia, unspecified: Secondary | ICD-10-CM | POA: Diagnosis not present

## 2019-01-31 DIAGNOSIS — Z1159 Encounter for screening for other viral diseases: Secondary | ICD-10-CM | POA: Diagnosis not present

## 2019-01-31 LAB — HEMOGLOBIN A1C: Hgb A1c MFr Bld: 5.5 % (ref 4.6–6.5)

## 2019-02-01 ENCOUNTER — Other Ambulatory Visit: Payer: Self-pay

## 2019-02-01 DIAGNOSIS — I10 Essential (primary) hypertension: Secondary | ICD-10-CM

## 2019-02-01 DIAGNOSIS — Z0000 Encounter for general adult medical examination without abnormal findings: Secondary | ICD-10-CM

## 2019-02-04 ENCOUNTER — Other Ambulatory Visit: Payer: Medicare Other

## 2019-02-05 DIAGNOSIS — K640 First degree hemorrhoids: Secondary | ICD-10-CM | POA: Diagnosis not present

## 2019-02-05 DIAGNOSIS — D122 Benign neoplasm of ascending colon: Secondary | ICD-10-CM | POA: Diagnosis not present

## 2019-02-05 DIAGNOSIS — D124 Benign neoplasm of descending colon: Secondary | ICD-10-CM | POA: Diagnosis not present

## 2019-02-05 DIAGNOSIS — K5730 Diverticulosis of large intestine without perforation or abscess without bleeding: Secondary | ICD-10-CM | POA: Diagnosis not present

## 2019-02-05 DIAGNOSIS — K635 Polyp of colon: Secondary | ICD-10-CM | POA: Diagnosis not present

## 2019-02-05 DIAGNOSIS — Z1211 Encounter for screening for malignant neoplasm of colon: Secondary | ICD-10-CM | POA: Diagnosis not present

## 2019-02-06 MED ORDER — LOSARTAN POTASSIUM 50 MG PO TABS: 50.0000 mg | ORAL_TABLET | Freq: Every day | ORAL | 2 refills | Status: DC

## 2019-02-11 ENCOUNTER — Other Ambulatory Visit: Payer: Medicare Other | Admitting: *Deleted

## 2019-02-11 ENCOUNTER — Other Ambulatory Visit: Payer: Self-pay

## 2019-02-11 DIAGNOSIS — I471 Supraventricular tachycardia: Secondary | ICD-10-CM | POA: Diagnosis not present

## 2019-02-11 DIAGNOSIS — I4510 Unspecified right bundle-branch block: Secondary | ICD-10-CM | POA: Diagnosis not present

## 2019-02-12 DIAGNOSIS — K635 Polyp of colon: Secondary | ICD-10-CM | POA: Diagnosis not present

## 2019-02-12 DIAGNOSIS — D124 Benign neoplasm of descending colon: Secondary | ICD-10-CM | POA: Diagnosis not present

## 2019-02-12 DIAGNOSIS — D122 Benign neoplasm of ascending colon: Secondary | ICD-10-CM | POA: Diagnosis not present

## 2019-02-12 LAB — BASIC METABOLIC PANEL
BUN/Creatinine Ratio: 10 (ref 10–24)
BUN: 9 mg/dL (ref 8–27)
CO2: 25 mmol/L (ref 20–29)
Calcium: 9 mg/dL (ref 8.6–10.2)
Chloride: 99 mmol/L (ref 96–106)
Creatinine, Ser: 0.91 mg/dL (ref 0.76–1.27)
GFR calc Af Amer: 100 mL/min/{1.73_m2} (ref >59)
GFR calc non Af Amer: 86 mL/min/{1.73_m2} (ref >59)
Glucose: 96 mg/dL (ref 65–99)
Potassium: 3.9 mmol/L (ref 3.5–5.2)
Sodium: 141 mmol/L (ref 134–144)

## 2019-03-12 ENCOUNTER — Telehealth: Payer: Self-pay

## 2019-03-12 DIAGNOSIS — Z79899 Other long term (current) drug therapy: Secondary | ICD-10-CM

## 2019-03-12 MED ORDER — LOSARTAN POTASSIUM-HCTZ 50-12.5 MG PO TABS: 1.0000 | ORAL_TABLET | Freq: Every day | ORAL | 3 refills | Status: DC

## 2019-03-12 NOTE — Telephone Encounter (Signed)
Spoke with patient in regards to medication changes per Dr. Caryl Comes based on patient's BP readings sent through [place].
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
## 2019-03-22 ENCOUNTER — Encounter (HOSPITAL_COMMUNITY): Payer: Self-pay

## 2019-03-22 ENCOUNTER — Other Ambulatory Visit: Payer: Self-pay

## 2019-03-22 ENCOUNTER — Encounter (HOSPITAL_COMMUNITY)
Admission: RE | Admit: 2019-03-22 | Discharge: 2019-03-22 | Disposition: A | Discharge status: Home / Self Care | Payer: Medicare Other | Source: Ambulatory Visit | Attending: Orthopedic Surgery | Admitting: Orthopedic Surgery

## 2019-03-22 ENCOUNTER — Other Ambulatory Visit (HOSPITAL_COMMUNITY)
Admission: RE | Admit: 2019-03-22 | Discharge: 2019-03-22 | Disposition: A | Discharge status: Home / Self Care | Payer: Medicare Other | Source: Ambulatory Visit | Attending: Orthopedic Surgery | Admitting: Orthopedic Surgery

## 2019-03-22 ENCOUNTER — Encounter: Payer: Self-pay | Admitting: Family Medicine

## 2019-03-22 DIAGNOSIS — M1712 Unilateral primary osteoarthritis, left knee: Secondary | ICD-10-CM | POA: Insufficient documentation

## 2019-03-22 DIAGNOSIS — Z01812 Encounter for preprocedural laboratory examination: Secondary | ICD-10-CM | POA: Insufficient documentation

## 2019-03-22 DIAGNOSIS — Z20822 Contact with and (suspected) exposure to covid-19: Secondary | ICD-10-CM | POA: Diagnosis not present

## 2019-03-22 HISTORY — DX: Psoriasis, unspecified: L40.9

## 2019-03-22 LAB — CBC
HCT: 43.2 % (ref 39.0–52.0)
Hemoglobin: 14.2 g/dL (ref 13.0–17.0)
MCH: 30.9 pg (ref 26.0–34.0)
MCHC: 32.9 g/dL (ref 30.0–36.0)
MCV: 94.1 fL (ref 80.0–100.0)
Platelets: 271 10*3/uL (ref 150–400)
RBC: 4.59 MIL/uL (ref 4.22–5.81)
RDW: 12.4 % (ref 11.5–15.5)
WBC: 7.1 10*3/uL (ref 4.0–10.5)
nRBC: 0 % (ref 0.0–0.2)

## 2019-03-22 LAB — COMPREHENSIVE METABOLIC PANEL
ALT: 70 U/L — ABNORMAL HIGH (ref 0–44)
AST: 48 U/L — ABNORMAL HIGH (ref 15–41)
Albumin: 4.6 g/dL (ref 3.5–5.0)
Alkaline Phosphatase: 82 U/L (ref 38–126)
Anion gap: 12 (ref 5–15)
BUN: 12 mg/dL (ref 8–23)
CO2: 28 mmol/L (ref 22–32)
Calcium: 9 mg/dL (ref 8.9–10.3)
Chloride: 98 mmol/L (ref 98–111)
Creatinine, Ser: 0.92 mg/dL (ref 0.61–1.24)
GFR calc Af Amer: >60 mL/min (ref >60)
GFR calc non Af Amer: >60 mL/min (ref >60)
Glucose, Bld: 110 mg/dL — ABNORMAL HIGH (ref 70–99)
Potassium: 4.2 mmol/L (ref 3.5–5.1)
Sodium: 138 mmol/L (ref 135–145)
Total Bilirubin: 1.4 mg/dL — ABNORMAL HIGH (ref 0.3–1.2)
Total Protein: 7.6 g/dL (ref 6.5–8.1)

## 2019-03-22 LAB — SURGICAL PCR SCREEN
MRSA, PCR: NEGATIVE
Staphylococcus aureus: NEGATIVE

## 2019-03-22 LAB — PROTIME-INR
INR: 1 (ref 0.8–1.2)
Prothrombin Time: 13.1 seconds (ref 11.4–15.2)

## 2019-03-22 LAB — SARS CORONAVIRUS 2 (TAT 6-24 HRS): SARS Coronavirus 2: NEGATIVE

## 2019-03-22 LAB — ABO/RH: ABO/RH(D): O NEG

## 2019-03-22 LAB — APTT: aPTT: 29 seconds (ref 24–36)

## 2019-03-22 NOTE — Patient Instructions (Addendum)
DUE TO COVID-19 ONLY ONE VISITOR IS ALLOWED TO COME WITH YOU AND STAY IN THE WAITING ROOM ONLY DURING PRE OP AND PROCEDURE DAY OF SURGERY. THE 1 VISITOR MAY VISIT WITH YOU AFTER SURGERY IN YOUR PRIVATE ROOM DURING VISITING HOURS ONLY!  YOU NEED TO HAVE A COVID 19 TEST ON__1/8/2021_____ @___12 :00PM____, THIS TEST MUST BE DONE BEFORE SURGERY, GO to the drive thru at  [id number], [place] , [id number].  ([place]) ONCE YOUR COVID TEST IS COMPLETED, PLEASE BEGIN THE QUARANTINE INSTRUCTIONS AS OUTLINED IN YOUR HANDOUT.                 [name]    Your procedure is scheduled on: 03/25/2019   Report to [facility] Main  Entrance   Report to admitting at 10:00AM     Call this number if you have problems the morning of surgery [phone]    Remember: NO SOLID FOOD AFTER MIDNIGHT THE NIGHT PRIOR TO SURGERY. YOU MAY DRINK CLEAR LIQUIDS.   STOP CLEAR LIQUIDS AT ___9:30AM______ AND THEN DRINK THE ENSURE PRE-SURGERY [place]. NOTHING BY MOUTH AFTER THE ENSURE DRINK!     CLEAR LIQUID DIET   Foods Allowed                                                                     Foods Excluded  Water,Coffee and tea, regular and decaf                             liquids that you cannot  Plain Jell-O any favor except red or purple                                           see through such as: Fruit ices (not with fruit pulp)                                     milk, soups, orange juice  Iced Popsicles                                    All solid food Carbonated beverages, regular and diet                                    Cranberry, grape and apple juices Sports drinks like Gatorade Lightly seasoned clear broth or consume(fat free) Sugar, honey syrup  Sample Menu Breakfast                                Lunch                                     Supper Cranberry juice  Beef broth                            Chicken broth Jell-O                                      Grape juice                           Apple juice Coffee or tea                        Jell-O                                      Popsicle                                                Coffee or tea                        Coffee or tea  _____________________________________________________________________   BRUSH YOUR TEETH MORNING OF SURGERY AND RINSE YOUR MOUTH OUT, NO CHEWING GUM CANDY OR MINTS.     Take these medicines the morning of surgery with A SIP OF WATER: DILTIAZEM, ATORVASTATIN                                  You may not have any metal on your body including hair pins and              piercings  Do not wear jewelry, make-up, lotions, powders or perfumes, deodorant             Do not wear nail polish on your fingernails.  Do not shave  48 hours prior to surgery.              Men may shave face and neck.   Do not bring valuables to the hospital. [place].  Contacts, dentures or bridgework may not be worn into surgery.  YOU MAY BRING A SMALL OVERNIGHT BAG     Patients discharged the day of surgery will not be allowed to drive home. IF YOU ARE HAVING SURGERY AND GOING HOME THE SAME DAY, YOU MUST HAVE AN ADULT TO DRIVE YOU HOME AND BE WITH YOU FOR 24 HOURS. YOU MAY GO HOME BY TAXI OR UBER OR ORTHERWISE, BUT AN ADULT MUST ACCOMPANY YOU HOME AND STAY WITH YOU FOR 24 HOURS.  Name and phone number of your driver:  Special Instructions: N/A              Please read over the following fact sheets you were given: _____________________________________________________________________             [facility] - Preparing for Surgery Before surgery, you can play an important role.  Because skin is not sterile, your skin needs to be as free of germs as possible.  You can reduce the  number of germs on your skin by washing with CHG (chlorahexidine gluconate) soap before surgery.  CHG is an antiseptic cleaner which  kills germs and bonds with the skin to continue killing germs even after washing. Please DO NOT use if you have an allergy to CHG or antibacterial soaps.  If your skin becomes reddened/irritated stop using the CHG and inform your nurse when you arrive at Short Stay. Do not shave (including legs and underarms) for at least 48 hours prior to the first CHG shower.  You may shave your face/neck. Please follow these instructions carefully:  1.  Shower with CHG Soap the night before surgery and the  morning of Surgery.  2.  If you choose to wash your hair, wash your hair first as usual with your  normal  shampoo.  3.  After you shampoo, rinse your hair and body thoroughly to remove the  shampoo.                           4.  Use CHG as you would any other liquid soap.  You can apply chg directly  to the skin and wash                       Gently with a scrungie or clean washcloth.  5.  Apply the CHG Soap to your body ONLY FROM THE NECK DOWN.   Do not use on face/ open                           Wound or open sores. Avoid contact with eyes, ears mouth and genitals (private parts).                       Wash face,  Genitals (private parts) with your normal soap.             6.  Wash thoroughly, paying special attention to the area where your surgery  will be performed.  7.  Thoroughly rinse your body with warm water from the neck down.  8.  DO NOT shower/wash with your normal soap after using and rinsing off  the CHG Soap.                9.  Pat yourself dry with a clean towel.            10.  Wear clean pajamas.            11.  Place clean sheets on your bed the night of your first shower and do not  sleep with pets. Day of Surgery : Do not apply any lotions/deodorants the morning of surgery.  Please wear clean clothes to the hospital/surgery center.  FAILURE TO FOLLOW THESE INSTRUCTIONS MAY RESULT IN THE CANCELLATION OF YOUR SURGERY PATIENT SIGNATURE_________________________________  NURSE  SIGNATURE__________________________________  ________________________________________________________________________    [name]  An incentive spirometer is a tool that can help keep your lungs clear and active. This tool measures how well you are filling your lungs with each breath. Taking long deep breaths may help reverse or decrease the chance of developing breathing (pulmonary) problems (especially infection) following:  A long period of time when you are unable to move or be active. BEFORE THE PROCEDURE   If the spirometer includes an indicator to show your best effort, your nurse or respiratory therapist will set it to  a desired goal.  If possible, sit up straight or lean slightly forward. Try not to slouch.  Hold the incentive spirometer in an upright position. INSTRUCTIONS FOR USE  1. Sit on the edge of your bed if possible, or sit up as far as you can in bed or on a chair. 2. Hold the incentive spirometer in an upright position. 3. Breathe out normally. 4. Place the mouthpiece in your mouth and seal your lips tightly around it. 5. Breathe in slowly and as deeply as possible, raising the piston or the ball toward the top of the column. 6. Hold your breath for 3-5 seconds or for as long as possible. Allow the piston or ball to fall to the bottom of the column. 7. Remove the mouthpiece from your mouth and breathe out normally. 8. Rest for a few seconds and repeat Steps 1 through 7 at least 10 times every 1-2 hours when you are awake. Take your time and take a few normal breaths between deep breaths. 9. The spirometer may include an indicator to show your best effort. Use the indicator as a goal to work toward during each repetition. 10. After each set of 10 deep breaths, practice coughing to be sure your lungs are clear. If you have an incision (the cut made at the time of surgery), support your incision when coughing by placing a pillow or rolled up towels firmly  against it. Once you are able to get out of bed, walk around indoors and cough well. You may stop using the incentive spirometer when instructed by your caregiver.  RISKS AND COMPLICATIONS  Take your time so you do not get dizzy or light-headed.  If you are in pain, you may need to take or ask for pain medication before doing incentive spirometry. It is harder to take a deep breath if you are having pain. AFTER USE  Rest and breathe slowly and easily.  It can be helpful to keep track of a log of your progress. Your caregiver can provide you with a simple table to help with this. If you are using the spirometer at home, follow these instructions: [place] IF:   You are having difficultly using the spirometer.  You have trouble using the spirometer as often as instructed.  Your pain medication is not giving enough relief while using the spirometer.  You develop fever of 100.5 F (38.1 C) or higher. SEEK IMMEDIATE MEDICAL CARE IF:   You cough up bloody sputum that had not been present before.  You develop fever of 102 F (38.9 C) or greater.  You develop worsening pain at or near the incision site. MAKE SURE YOU:   Understand these instructions.  Will watch your condition.  Will get help right away if you are not doing well or get worse. Document Released: 07/11/2006 Document Revised: 05/23/2011 Document Reviewed: 09/11/2006 ExitCare Patient Information 2014 Exit[employer], [place].   ________________________________________________________________________  WHAT IS A BLOOD TRANSFUSION? Blood Transfusion Information  A transfusion is the replacement of blood or some of its parts. Blood is made up of multiple cells which provide different functions.  Red blood cells carry oxygen and are used for blood loss replacement.  White blood cells fight against infection.  Platelets control bleeding.  Plasma helps clot blood.  Other blood products are available for  specialized needs, such as hemophilia or other clotting disorders. BEFORE THE TRANSFUSION  Who gives blood for transfusions?   Healthy volunteers who are fully evaluated to make  sure their blood is safe. This is blood bank blood. Transfusion therapy is the safest it has ever been in the practice of medicine. Before blood is taken from a donor, a complete history is taken to make sure that person has no history of diseases nor engages in risky social behavior (examples are intravenous drug use or sexual activity with multiple partners). The donor's travel history is screened to minimize risk of transmitting infections, such as malaria. The donated blood is tested for signs of infectious diseases, such as HIV and hepatitis. The blood is then tested to be sure it is compatible with you in order to minimize the chance of a transfusion reaction. If you or a relative donates blood, this is often done in anticipation of surgery and is not appropriate for emergency situations. It takes many days to process the donated blood. RISKS AND COMPLICATIONS Although transfusion therapy is very safe and saves many lives, the main dangers of transfusion include:   Getting an infectious disease.  Developing a transfusion reaction. This is an allergic reaction to something in the blood you were given. Every precaution is taken to prevent this. The decision to have a blood transfusion has been considered carefully by your caregiver before blood is given. Blood is not given unless the benefits outweigh the risks. AFTER THE TRANSFUSION  Right after receiving a blood transfusion, you will usually feel much better and more energetic. This is especially true if your red blood cells have gotten low (anemic). The transfusion raises the level of the red blood cells which carry oxygen, and this usually causes an energy increase.  The nurse administering the transfusion will monitor you carefully for complications. HOME CARE  INSTRUCTIONS  No special instructions are needed after a transfusion. You may find your energy is better. Speak with your caregiver about any limitations on activity for underlying diseases you may have. SEEK MEDICAL CARE IF:   Your condition is not improving after your transfusion.  You develop redness or irritation at the intravenous (IV) site. SEEK IMMEDIATE MEDICAL CARE IF:  Any of the following symptoms occur over the next 12 hours:  Shaking chills.  You have a temperature by mouth above 102 F (38.9 C), not controlled by medicine.  Chest, back, or muscle pain.  People around you feel you are not acting correctly or are confused.  Shortness of breath or difficulty breathing.  Dizziness and fainting.  You get a rash or develop hives.  You have a decrease in urine output.  Your urine turns a dark color or changes to pink, red, or brown. Any of the following symptoms occur over the next 10 days:  You have a temperature by mouth above 102 F (38.9 C), not controlled by medicine.  Shortness of breath.  Weakness after normal activity.  The white part of the eye turns yellow (jaundice).  You have a decrease in the amount of urine or are urinating less often.  Your urine turns a dark color or changes to pink, red, or brown. Document Released: 02/26/2000 Document Revised: 05/23/2011 Document Reviewed: 10/15/2007 [facility] Patient Information 2014 [place], [place].  _______________________________________________________________________

## 2019-03-22 NOTE — Progress Notes (Signed)
PCP - Shelda Pal, DO Cardiologist - Deboraha Sprang, MD, cardiac clearance Kathyrn Drown 01/30/2019 epic tele note ; lov 01/28/2019 epic   Chest x-ray -  EKG - 01/28/2019 epic  Stress Test -  ECHO -  Cardiac Cath -   Sleep Study - n/a CPAP -   Fasting Blood Sugar - n/a Checks Blood Sugar _____ times a day  Blood Thinner Instructions: n/a Aspirin Instructions: Last Dose:  Anesthesia review:   Hx of SVT ,RBB. Patient denies any acute cardiac symptoms at this time. Reports a few months ago , he was started on new BP medication losartan -hctz to attain better BP control prior to surgery. He confirms that his BP readings have improved since starting new medication.   Patient denies shortness of breath, fever, cough and chest pain at PAT appointment   Patient verbalized understanding of instructions that were given to them at the PAT appointment. Patient was also instructed that they will need to review over the PAT instructions again at home before surgery.

## 2019-03-24 MED ORDER — BUPIVACAINE LIPOSOME 1.3 % IJ SUSP
20.0000 mL | Freq: Once | INTRAMUSCULAR | Status: DC
  Filled 2019-03-24: qty 20

## 2019-03-25 ENCOUNTER — Other Ambulatory Visit: Payer: Self-pay

## 2019-03-25 ENCOUNTER — Other Ambulatory Visit: Payer: Medicare Other

## 2019-03-25 ENCOUNTER — Ambulatory Visit (HOSPITAL_COMMUNITY): Payer: Medicare Other | Admitting: Physician Assistant

## 2019-03-25 ENCOUNTER — Ambulatory Visit (HOSPITAL_COMMUNITY)
Admission: RE | Admit: 2019-03-25 | Discharge: 2019-03-25 | Disposition: A | Discharge status: Home / Self Care | Payer: Medicare Other | Attending: Orthopedic Surgery | Admitting: Orthopedic Surgery

## 2019-03-25 ENCOUNTER — Ambulatory Visit (HOSPITAL_COMMUNITY): Payer: Medicare Other | Admitting: Anesthesiology

## 2019-03-25 ENCOUNTER — Encounter (HOSPITAL_COMMUNITY): Payer: Self-pay | Admitting: Orthopedic Surgery

## 2019-03-25 ENCOUNTER — Encounter (HOSPITAL_COMMUNITY)
Admission: RE | Disposition: A | Discharge status: Home / Self Care | Payer: Self-pay | Source: Home / Self Care | Attending: Orthopedic Surgery

## 2019-03-25 DIAGNOSIS — I10 Essential (primary) hypertension: Secondary | ICD-10-CM | POA: Insufficient documentation

## 2019-03-25 DIAGNOSIS — M1712 Unilateral primary osteoarthritis, left knee: Secondary | ICD-10-CM | POA: Diagnosis not present

## 2019-03-25 DIAGNOSIS — Z79899 Other long term (current) drug therapy: Secondary | ICD-10-CM | POA: Insufficient documentation

## 2019-03-25 DIAGNOSIS — I471 Supraventricular tachycardia: Secondary | ICD-10-CM | POA: Insufficient documentation

## 2019-03-25 DIAGNOSIS — E669 Obesity, unspecified: Secondary | ICD-10-CM | POA: Insufficient documentation

## 2019-03-25 DIAGNOSIS — Z7982 Long term (current) use of aspirin: Secondary | ICD-10-CM | POA: Insufficient documentation

## 2019-03-25 DIAGNOSIS — Z6831 Body mass index (BMI) 31.0-31.9, adult: Secondary | ICD-10-CM | POA: Insufficient documentation

## 2019-03-25 DIAGNOSIS — M170 Bilateral primary osteoarthritis of knee: Secondary | ICD-10-CM | POA: Diagnosis present

## 2019-03-25 DIAGNOSIS — N529 Male erectile dysfunction, unspecified: Secondary | ICD-10-CM | POA: Insufficient documentation

## 2019-03-25 DIAGNOSIS — E782 Mixed hyperlipidemia: Secondary | ICD-10-CM | POA: Insufficient documentation

## 2019-03-25 DIAGNOSIS — Z87891 Personal history of nicotine dependence: Secondary | ICD-10-CM | POA: Diagnosis not present

## 2019-03-25 DIAGNOSIS — G8918 Other acute postprocedural pain: Secondary | ICD-10-CM | POA: Diagnosis not present

## 2019-03-25 HISTORY — PX: TOTAL KNEE ARTHROPLASTY: SHX125

## 2019-03-25 LAB — TYPE AND SCREEN
ABO/RH(D): O NEG
Antibody Screen: NEGATIVE

## 2019-03-25 SURGERY — ARTHROPLASTY, KNEE, TOTAL
Anesthesia: Spinal | Site: Knee | Laterality: Left

## 2019-03-25 MED ORDER — BISACODYL 10 MG RE SUPP: 10.0000 mg | Freq: Every day | RECTAL | Status: DC | PRN

## 2019-03-25 MED ORDER — GABAPENTIN 300 MG PO CAPS: ORAL_CAPSULE | ORAL | 0 refills | Status: DC

## 2019-03-25 MED ORDER — DEXAMETHASONE SODIUM PHOSPHATE 10 MG/ML IJ SOLN
INTRAMUSCULAR | Status: AC
  Filled 2019-03-25: qty 1

## 2019-03-25 MED ORDER — SODIUM CHLORIDE (PF) 0.9 % IJ SOLN
INTRAMUSCULAR | Status: AC
  Filled 2019-03-25: qty 10

## 2019-03-25 MED ORDER — TRAMADOL HCL 50 MG PO TABS
50.0000 mg | ORAL_TABLET | Freq: Four times a day (QID) | ORAL | Status: DC | PRN
  Administered 2019-03-25: 50 mg via ORAL

## 2019-03-25 MED ORDER — GABAPENTIN 300 MG PO CAPS
300.0000 mg | ORAL_CAPSULE | Freq: Three times a day (TID) | ORAL | Status: DC
  Administered 2019-03-25: 300 mg via ORAL

## 2019-03-25 MED ORDER — ONDANSETRON HCL 4 MG PO TABS: 4.0000 mg | ORAL_TABLET | Freq: Four times a day (QID) | ORAL | Status: DC | PRN

## 2019-03-25 MED ORDER — CEFAZOLIN SODIUM-DEXTROSE 2-4 GM/100ML-% IV SOLN
2.0000 g | INTRAVENOUS | Status: AC
  Administered 2019-03-25: 13:00:00 2 g via INTRAVENOUS
  Filled 2019-03-25: qty 100

## 2019-03-25 MED ORDER — CEFAZOLIN SODIUM-DEXTROSE 2-4 GM/100ML-% IV SOLN
INTRAVENOUS | Status: AC
  Filled 2019-03-25: qty 100

## 2019-03-25 MED ORDER — FENTANYL CITRATE (PF) 100 MCG/2ML IJ SOLN
25.0000 ug | INTRAMUSCULAR | Status: DC | PRN
  Administered 2019-03-25 (×3): 50 ug via INTRAVENOUS

## 2019-03-25 MED ORDER — DEXAMETHASONE SODIUM PHOSPHATE 10 MG/ML IJ SOLN
8.0000 mg | Freq: Once | INTRAMUSCULAR | Status: AC
  Administered 2019-03-25: 13:00:00 8 mg via INTRAVENOUS

## 2019-03-25 MED ORDER — ACETAMINOPHEN 500 MG PO TABS: 1000.0000 mg | ORAL_TABLET | Freq: Four times a day (QID) | ORAL | Status: DC

## 2019-03-25 MED ORDER — MENTHOL 3 MG MT LOZG: 1.0000 | LOZENGE | OROMUCOSAL | Status: DC | PRN

## 2019-03-25 MED ORDER — OXYCODONE HCL 5 MG/5ML PO SOLN: 5.0000 mg | Freq: Once | ORAL | Status: DC | PRN

## 2019-03-25 MED ORDER — LACTATED RINGERS IV BOLUS
250.0000 mL | Freq: Once | INTRAVENOUS | Status: AC
  Administered 2019-03-25: 250 mL via INTRAVENOUS

## 2019-03-25 MED ORDER — SODIUM CHLORIDE 0.9 % IR SOLN
Status: DC | PRN
  Administered 2019-03-25: 1000 mL

## 2019-03-25 MED ORDER — SODIUM CHLORIDE (PF) 0.9 % IJ SOLN
INTRAMUSCULAR | Status: DC | PRN
  Administered 2019-03-25: 60 mL

## 2019-03-25 MED ORDER — FENTANYL CITRATE (PF) 100 MCG/2ML IJ SOLN
50.0000 ug | INTRAMUSCULAR | Status: DC
  Administered 2019-03-25: 11:00:00 50 ug via INTRAVENOUS
  Filled 2019-03-25: qty 2

## 2019-03-25 MED ORDER — LACTATED RINGERS IV SOLN: INTRAVENOUS | Status: DC

## 2019-03-25 MED ORDER — ASPIRIN EC 325 MG PO TBEC: 325.0000 mg | DELAYED_RELEASE_TABLET | Freq: Two times a day (BID) | ORAL | 0 refills | Status: AC

## 2019-03-25 MED ORDER — ONDANSETRON HCL 4 MG/2ML IJ SOLN
INTRAMUSCULAR | Status: AC
  Filled 2019-03-25: qty 2

## 2019-03-25 MED ORDER — OXYCODONE HCL 5 MG PO TABS: 5.0000 mg | ORAL_TABLET | Freq: Once | ORAL | Status: DC | PRN

## 2019-03-25 MED ORDER — MIDAZOLAM HCL 2 MG/2ML IJ SOLN
1.0000 mg | INTRAMUSCULAR | Status: DC
  Administered 2019-03-25: 2 mg via INTRAVENOUS
  Filled 2019-03-25: qty 2

## 2019-03-25 MED ORDER — OXYCODONE HCL 5 MG PO TABS: 5.0000 mg | ORAL_TABLET | ORAL | Status: DC | PRN

## 2019-03-25 MED ORDER — SODIUM CHLORIDE 0.9 % IV SOLN: INTRAVENOUS | Status: DC

## 2019-03-25 MED ORDER — TRANEXAMIC ACID-NACL 1000-0.7 MG/100ML-% IV SOLN
1000.0000 mg | INTRAVENOUS | Status: AC
  Administered 2019-03-25: 13:00:00 1000 mg via INTRAVENOUS
  Filled 2019-03-25: qty 100

## 2019-03-25 MED ORDER — DILTIAZEM HCL ER COATED BEADS 180 MG PO CP24: 180.0000 mg | ORAL_CAPSULE | Freq: Every day | ORAL | Status: DC

## 2019-03-25 MED ORDER — TRANEXAMIC ACID-NACL 1000-0.7 MG/100ML-% IV SOLN
INTRAVENOUS | Status: AC
  Filled 2019-03-25: qty 100

## 2019-03-25 MED ORDER — METHOCARBAMOL 500 MG PO TABS: 500.0000 mg | ORAL_TABLET | Freq: Four times a day (QID) | ORAL | 0 refills | Status: DC | PRN

## 2019-03-25 MED ORDER — METHOCARBAMOL 500 MG IVPB - SIMPLE MED
500.0000 mg | Freq: Four times a day (QID) | INTRAVENOUS | Status: DC | PRN
  Administered 2019-03-25: 500 mg via INTRAVENOUS

## 2019-03-25 MED ORDER — GABAPENTIN 300 MG PO CAPS
ORAL_CAPSULE | ORAL | Status: AC
  Filled 2019-03-25: qty 1

## 2019-03-25 MED ORDER — ONDANSETRON HCL 4 MG/2ML IJ SOLN
INTRAMUSCULAR | Status: DC | PRN
  Administered 2019-03-25: 4 mg via INTRAVENOUS

## 2019-03-25 MED ORDER — MORPHINE SULFATE (PF) 4 MG/ML IV SOLN: 0.5000 mg | INTRAVENOUS | Status: DC | PRN

## 2019-03-25 MED ORDER — SODIUM CHLORIDE (PF) 0.9 % IJ SOLN
INTRAMUSCULAR | Status: AC
  Filled 2019-03-25: qty 50

## 2019-03-25 MED ORDER — METHOCARBAMOL 500 MG IVPB - SIMPLE MED
INTRAVENOUS | Status: AC
  Filled 2019-03-25: qty 50

## 2019-03-25 MED ORDER — DEXAMETHASONE SODIUM PHOSPHATE 10 MG/ML IJ SOLN: 10.0000 mg | Freq: Once | INTRAMUSCULAR | Status: DC

## 2019-03-25 MED ORDER — POLYETHYLENE GLYCOL 3350 17 G PO PACK: 17.0000 g | PACK | Freq: Every day | ORAL | Status: DC | PRN

## 2019-03-25 MED ORDER — POVIDONE-IODINE 10 % EX SWAB
2.0000 "application " | Freq: Once | CUTANEOUS | Status: AC
  Administered 2019-03-25: 2 via TOPICAL

## 2019-03-25 MED ORDER — FENTANYL CITRATE (PF) 100 MCG/2ML IJ SOLN
INTRAMUSCULAR | Status: AC
  Filled 2019-03-25: qty 4

## 2019-03-25 MED ORDER — TRAMADOL HCL 50 MG PO TABS: 50.0000 mg | ORAL_TABLET | Freq: Four times a day (QID) | ORAL | 0 refills | Status: DC | PRN

## 2019-03-25 MED ORDER — METOCLOPRAMIDE HCL 5 MG/ML IJ SOLN: 5.0000 mg | Freq: Three times a day (TID) | INTRAMUSCULAR | Status: DC | PRN

## 2019-03-25 MED ORDER — ONDANSETRON HCL 4 MG/2ML IJ SOLN: 4.0000 mg | Freq: Four times a day (QID) | INTRAMUSCULAR | Status: DC | PRN

## 2019-03-25 MED ORDER — MEPIVACAINE HCL (PF) 2 % IJ SOLN
INTRAMUSCULAR | Status: AC
  Filled 2019-03-25: qty 20

## 2019-03-25 MED ORDER — PROPOFOL 500 MG/50ML IV EMUL
INTRAVENOUS | Status: DC | PRN
  Administered 2019-03-25: 30 mg via INTRAVENOUS
  Administered 2019-03-25: 50 ug/kg/min via INTRAVENOUS
  Administered 2019-03-25: 30 mg via INTRAVENOUS
  Administered 2019-03-25: 40 mg via INTRAVENOUS
  Administered 2019-03-25: 75 mg via INTRAVENOUS

## 2019-03-25 MED ORDER — ATORVASTATIN CALCIUM 20 MG PO TABS: 20.0000 mg | ORAL_TABLET | Freq: Every day | ORAL | Status: DC

## 2019-03-25 MED ORDER — FENTANYL CITRATE (PF) 100 MCG/2ML IJ SOLN
INTRAMUSCULAR | Status: DC | PRN
  Administered 2019-03-25: 50 ug via INTRAVENOUS
  Administered 2019-03-25: 25 ug via INTRAVENOUS
  Administered 2019-03-25 (×3): 50 ug via INTRAVENOUS
  Administered 2019-03-25: 25 ug via INTRAVENOUS

## 2019-03-25 MED ORDER — METOCLOPRAMIDE HCL 5 MG PO TABS: 5.0000 mg | ORAL_TABLET | Freq: Three times a day (TID) | ORAL | Status: DC | PRN

## 2019-03-25 MED ORDER — PHENOL 1.4 % MT LIQD: 1.0000 | OROMUCOSAL | Status: DC | PRN

## 2019-03-25 MED ORDER — METHOCARBAMOL 500 MG PO TABS: 500.0000 mg | ORAL_TABLET | Freq: Four times a day (QID) | ORAL | Status: DC | PRN

## 2019-03-25 MED ORDER — ACETAMINOPHEN 10 MG/ML IV SOLN
1000.0000 mg | Freq: Four times a day (QID) | INTRAVENOUS | Status: DC
  Administered 2019-03-25: 12:00:00 1000 mg via INTRAVENOUS
  Filled 2019-03-25: qty 100

## 2019-03-25 MED ORDER — DOCUSATE SODIUM 100 MG PO CAPS: 100.0000 mg | ORAL_CAPSULE | Freq: Two times a day (BID) | ORAL | Status: DC

## 2019-03-25 MED ORDER — CHLORHEXIDINE GLUCONATE 4 % EX LIQD: 60.0000 mL | Freq: Once | CUTANEOUS | Status: DC

## 2019-03-25 MED ORDER — BUPIVACAINE LIPOSOME 1.3 % IJ SUSP
INTRAMUSCULAR | Status: DC | PRN
  Administered 2019-03-25: 20 mL

## 2019-03-25 MED ORDER — FENTANYL CITRATE (PF) 250 MCG/5ML IJ SOLN
INTRAMUSCULAR | Status: AC
  Filled 2019-03-25: qty 5

## 2019-03-25 MED ORDER — TRANEXAMIC ACID-NACL 1000-0.7 MG/100ML-% IV SOLN
1000.0000 mg | Freq: Once | INTRAVENOUS | Status: AC
  Administered 2019-03-25: 1000 mg via INTRAVENOUS

## 2019-03-25 MED ORDER — DEXMEDETOMIDINE HCL IN NACL 200 MCG/50ML IV SOLN
INTRAVENOUS | Status: DC | PRN
  Administered 2019-03-25 (×4): 8 ug via INTRAVENOUS

## 2019-03-25 MED ORDER — MEPERIDINE HCL 50 MG/ML IJ SOLN: 6.2500 mg | INTRAMUSCULAR | Status: DC | PRN

## 2019-03-25 MED ORDER — OXYCODONE HCL 5 MG PO TABS: 5.0000 mg | ORAL_TABLET | Freq: Four times a day (QID) | ORAL | 0 refills | Status: DC | PRN

## 2019-03-25 MED ORDER — CEFAZOLIN SODIUM-DEXTROSE 2-4 GM/100ML-% IV SOLN
2.0000 g | Freq: Four times a day (QID) | INTRAVENOUS | Status: DC
  Administered 2019-03-25: 18:00:00 2 g via INTRAVENOUS

## 2019-03-25 MED ORDER — PROPOFOL 500 MG/50ML IV EMUL
INTRAVENOUS | Status: AC
  Filled 2019-03-25: qty 50

## 2019-03-25 MED ORDER — DIPHENHYDRAMINE HCL 12.5 MG/5ML PO ELIX: 12.5000 mg | ORAL_SOLUTION | ORAL | Status: DC | PRN

## 2019-03-25 MED ORDER — TRAMADOL HCL 50 MG PO TABS
ORAL_TABLET | ORAL | Status: AC
  Filled 2019-03-25: qty 1

## 2019-03-25 MED ORDER — LACTATED RINGERS IV BOLUS
500.0000 mL | Freq: Once | INTRAVENOUS | Status: AC
  Administered 2019-03-25: 500 mL via INTRAVENOUS

## 2019-03-25 MED ORDER — ROPIVACAINE HCL 7.5 MG/ML IJ SOLN
INTRAMUSCULAR | Status: DC | PRN
  Administered 2019-03-25: 20 mL via PERINEURAL

## 2019-03-25 MED ORDER — LOSARTAN POTASSIUM-HCTZ 50-12.5 MG PO TABS: 1.0000 | ORAL_TABLET | Freq: Every day | ORAL | Status: DC

## 2019-03-25 MED ORDER — ASPIRIN EC 325 MG PO TBEC: 325.0000 mg | DELAYED_RELEASE_TABLET | Freq: Two times a day (BID) | ORAL | Status: DC

## 2019-03-25 MED ORDER — FLEET ENEMA 7-19 GM/118ML RE ENEM: 1.0000 | ENEMA | Freq: Once | RECTAL | Status: DC | PRN

## 2019-03-25 MED ORDER — DEXMEDETOMIDINE HCL IN NACL 200 MCG/50ML IV SOLN
INTRAVENOUS | Status: AC
  Filled 2019-03-25: qty 50

## 2019-03-25 MED ORDER — ONDANSETRON HCL 4 MG/2ML IJ SOLN: 4.0000 mg | Freq: Once | INTRAMUSCULAR | Status: DC | PRN

## 2019-03-25 SURGICAL SUPPLY — 62 items
ATTUNE MED DOME PAT 41 KNEE (Knees) ×2 IMPLANT
ATTUNE PS FEM LT CEM SZ9 KNEE (Femur) ×2 IMPLANT
ATTUNE PS RP INSR SZ9 8 KNEE (Femur) ×2 IMPLANT
BAG ZIPLOCK 12X15 (MISCELLANEOUS) ×2 IMPLANT
BASE TIBIAL ROT PLAT SZ 8 KNEE (Knees) ×1 IMPLANT
BLADE SAG 18X100X1.27 (BLADE) ×2 IMPLANT
BLADE SAW SGTL 11.0X1.19X90.0M (BLADE) ×2 IMPLANT
BLADE SURG SZ10 CARB STEEL (BLADE) ×4 IMPLANT
BNDG ELASTIC 6X5.8 VLCR STR LF (GAUZE/BANDAGES/DRESSINGS) ×2 IMPLANT
BOWL SMART MIX CTS (DISPOSABLE) ×2 IMPLANT
CEMENT HV SMART SET (Cement) ×4 IMPLANT
CLSR STERI-STRIP ANTIMIC 1/2X4 (GAUZE/BANDAGES/DRESSINGS) ×2 IMPLANT
COVER SURGICAL LIGHT HANDLE (MISCELLANEOUS) ×2 IMPLANT
COVER WAND RF STERILE (DRAPES) IMPLANT
CUFF TOURN SGL QUICK 34 (TOURNIQUET CUFF) ×1
CUFF TRNQT CYL 34X4.125X (TOURNIQUET CUFF) ×1 IMPLANT
DECANTER SPIKE VIAL GLASS SM (MISCELLANEOUS) ×2 IMPLANT
DRAPE U-SHAPE 47X51 STRL (DRAPES) ×2 IMPLANT
DRSG ADAPTIC 3X8 NADH LF (GAUZE/BANDAGES/DRESSINGS) ×2 IMPLANT
DRSG AQUACEL AG ADV 3.5X 6 (GAUZE/BANDAGES/DRESSINGS) IMPLANT
DRSG PAD ABDOMINAL 8X10 ST (GAUZE/BANDAGES/DRESSINGS) ×2 IMPLANT
DURAPREP 26ML APPLICATOR (WOUND CARE) ×2 IMPLANT
ELECT REM PT RETURN 15FT ADLT (MISCELLANEOUS) ×2 IMPLANT
EVACUATOR 1/8 PVC DRAIN (DRAIN) ×2 IMPLANT
GAUZE SPONGE 2X2 8PLY STRL LF (GAUZE/BANDAGES/DRESSINGS) IMPLANT
GAUZE SPONGE 4X4 12PLY STRL (GAUZE/BANDAGES/DRESSINGS) ×2 IMPLANT
GLOVE BIO SURGEON STRL SZ7 (GLOVE) ×2 IMPLANT
GLOVE BIO SURGEON STRL SZ8 (GLOVE) ×2 IMPLANT
GLOVE BIOGEL PI IND STRL 6.5 (GLOVE) ×1 IMPLANT
GLOVE BIOGEL PI IND STRL 7.0 (GLOVE) ×1 IMPLANT
GLOVE BIOGEL PI IND STRL 8 (GLOVE) ×1 IMPLANT
GLOVE BIOGEL PI INDICATOR 6.5 (GLOVE) ×1
GLOVE BIOGEL PI INDICATOR 7.0 (GLOVE) ×1
GLOVE BIOGEL PI INDICATOR 8 (GLOVE) ×1
GLOVE SURG SS PI 6.5 STRL IVOR (GLOVE) ×2 IMPLANT
GOWN STRL REUS W/TWL LRG LVL3 (GOWN DISPOSABLE) ×6 IMPLANT
HANDPIECE INTERPULSE COAX TIP (DISPOSABLE) ×1
HOLDER FOLEY CATH W/STRAP (MISCELLANEOUS) IMPLANT
IMMOBILIZER KNEE 20 (SOFTGOODS) ×2
IMMOBILIZER KNEE 20 THIGH 36 (SOFTGOODS) ×1 IMPLANT
KIT TURNOVER KIT A (KITS) IMPLANT
MANIFOLD NEPTUNE II (INSTRUMENTS) ×2 IMPLANT
NS IRRIG 1000ML POUR BTL (IV SOLUTION) ×2 IMPLANT
PACK TOTAL KNEE CUSTOM (KITS) ×2 IMPLANT
PADDING CAST COTTON 6X4 STRL (CAST SUPPLIES) ×2 IMPLANT
PENCIL SMOKE EVACUATOR (MISCELLANEOUS) IMPLANT
PIN DRILL FIX HALF THREAD (BIT) ×2 IMPLANT
PIN STEINMAN FIXATION KNEE (PIN) ×2 IMPLANT
PROTECTOR NERVE ULNAR (MISCELLANEOUS) ×2 IMPLANT
SET HNDPC FAN SPRY TIP SCT (DISPOSABLE) ×1 IMPLANT
SPONGE GAUZE 2X2 STER 10/PKG (GAUZE/BANDAGES/DRESSINGS)
STRIP CLOSURE SKIN 1/2X4 (GAUZE/BANDAGES/DRESSINGS) ×4 IMPLANT
SUT MNCRL AB 4-0 PS2 18 (SUTURE) ×2 IMPLANT
SUT STRATAFIX 0 PDS 27 VIOLET (SUTURE) ×2
SUT VIC AB 2-0 CT1 27 (SUTURE) ×3
SUT VIC AB 2-0 CT1 TAPERPNT 27 (SUTURE) ×3 IMPLANT
SUTURE STRATFX 0 PDS 27 VIOLET (SUTURE) ×1 IMPLANT
TIBIAL BASE ROT PLAT SZ 8 KNEE (Knees) ×2 IMPLANT
TRAY FOLEY MTR SLVR 16FR STAT (SET/KITS/TRAYS/PACK) ×2 IMPLANT
WATER STERILE IRR 1000ML POUR (IV SOLUTION) ×4 IMPLANT
WRAP KNEE MAXI GEL POST OP (GAUZE/BANDAGES/DRESSINGS) ×2 IMPLANT
YANKAUER SUCT BULB TIP 10FT TU (MISCELLANEOUS) ×2 IMPLANT

## 2019-03-25 NOTE — Evaluation (Signed)
Physical Therapy Evaluation Patient Details Name: [name] MRN: [id number] DOB: 1950/05/22 Today's Date: 03/25/2019   History of Present Illness  s/p R TKA  Clinical Impression  Pt is s/p TKA resulting in the deficits listed below (see PT Problem List).  See below for details, ready to d/c from PT standpoint. Pt will benefit from skilled PT to increase their independence and safety with mobility to allow discharge to the venue listed below.    Follow Up Recommendations Follow surgeon's recommendation for DC plan and follow-up therapies    Equipment Recommendations  3in1 (PT);Rolling walker with 5" wheels    Recommendations for Other Services       Precautions / Restrictions Precautions Precautions: Fall;Knee Required Braces or Orthoses: Knee Immobilizer - Right Knee Immobilizer - Right: Discontinue once straight leg raise with < 10 degree lag      Mobility  Bed Mobility Overal bed mobility: Needs Assistance Bed Mobility: Supine to Sit;Sit to Supine     Supine to sit: Supervision;Min guard Sit to supine: Supervision;Min guard   General bed mobility comments: for safety  Transfers Overall transfer level: Needs assistance Equipment used: Rolling walker (2 wheeled) Transfers: Sit to/from Stand Sit to Stand: Min guard         General transfer comment: cues for hand placement and safety  Ambulation/Gait Ambulation/Gait assistance: Min guard Gait Distance (Feet): 100 Feet Assistive device: Rolling walker (2 wheeled) Gait Pattern/deviations: Step-to pattern     General Gait Details: cues for sequence and RW position  Stairs Stairs: Yes Stairs assistance: Min guard Stair Management: One rail Right;One rail Left Number of Stairs: 3 General stair comments: cues for sequence and safety  Wheelchair Mobility    Modified Rankin (Stroke Patients Only)       Balance Overall balance assessment: Mild deficits observed, not formally tested                                            Pertinent Vitals/Pain      Home Living Family/patient expects to be discharged to:: Private residence Living Arrangements: Spouse/significant other   Type of Home: House Home Access: Stairs to enter   [employer] of Steps: 1 and 1 Home Layout: One [place]: [place] - 2 wheels;Bedside commode      Prior Function Level of Independence: Independent               Hand Dominance        Extremity/Trunk Assessment   Upper Extremity Assessment Upper Extremity Assessment: Overall WFL for tasks assessed    Lower Extremity Assessment Lower Extremity Assessment: RLE deficits/detail RLE Deficits / Details: ankle WFL; knee extension and hip flexion 3/5       Communication   Communication: No difficulties  Cognition Arousal/Alertness: Awake/alert Behavior During Therapy: WFL for tasks assessed/performed Overall Cognitive Status: Within Functional Limits for tasks assessed                                        General Comments      Exercises Total Joint Exercises Ankle Circles/Pumps: AROM;Both;10 reps Quad Sets: 10 reps;AROM Heel Slides: 10 reps;AAROM Hip ABduction/ADduction: 10 reps;AAROM Straight Leg Raises: AAROM;AROM   Assessment/Plan    PT Assessment All further PT needs can be met in  the next venue of care  PT Problem List         PT Treatment Interventions      PT Goals (Current goals can be found in the Care Plan section)  Acute Rehab PT Goals PT Goal Formulation: All assessment and education complete, DC therapy    Frequency     Barriers to discharge        Co-evaluation               AM-PAC PT "6 Clicks" Mobility  Outcome Measure Help needed turning from your back to your side while in a flat bed without using bedrails?: A Little Help needed moving from lying on your back to sitting on the side of a flat bed without using bedrails?: A Little Help  needed moving to and from a bed to a chair (including a wheelchair)?: A Little Help needed standing up from a chair using your arms (e.g., wheelchair or bedside chair)?: A Little Help needed to walk in hospital room?: A Little Help needed climbing 3-5 steps with a railing? : A Little 6 Click Score: 18    End of Session Equipment Utilized During Treatment: Gait belt;Right knee immobilizer Activity Tolerance: Patient tolerated treatment well Patient left: in bed;with call bell/phone within reach Nurse Communication: Mobility status PT Visit Diagnosis: Muscle weakness (generalized) (M62.81);Difficulty in walking, not elsewhere classified (R26.2)    Time: 5631-4970 PT Time Calculation (min) (ACUTE ONLY): 28 min   Charges:   PT Evaluation $PT Eval Low Complexity: 1 Low PT Treatments $Gait Training: 8-22 mins        Baxter Flattery, PT   Acute Rehab Dept [facility]): [phone]   03/25/2019   [facility] 03/25/2019, 6:13 PM

## 2019-03-25 NOTE — Interval H&P Note (Signed)
History and Physical Interval Note:  03/25/2019 10:23 AM  [name]  has presented today for surgery, with the diagnosis of Left knee osteoarthritis.  The various methods of treatment have been discussed with the patient and family. After consideration of risks, benefits and other options for treatment, the patient has consented to  Procedure(s) with comments: TOTAL KNEE ARTHROPLASTY (Left) - 1min as a surgical intervention.  The patient's history has been reviewed, patient examined, no change in status, stable for surgery.  I have reviewed the patient's chart and labs.  Questions were answered to the patient's satisfaction.     Pilar Plate Hermione Havlicek

## 2019-03-25 NOTE — Anesthesia Procedure Notes (Addendum)
Anesthesia Regional Block: Adductor canal block   Pre-Anesthetic Checklist: ,, timeout performed, Correct Patient, Correct Site, Correct Laterality, Correct Procedure, Correct Position, site marked, Risks and benefits discussed,  Surgical consent,  Pre-op evaluation,  At surgeon's request and post-op pain management  Laterality: Left  Prep: chloraprep       Needles:  Injection technique: Single-shot  Needle Type: Echogenic Stimulator Needle     Needle Length: 9cm  Needle Gauge: 21   Needle insertion depth: 7 cm   Additional Needles:   Procedures:,,,, ultrasound used (permanent image in chart),,,,  Narrative:  Start time: 03/25/2019 10:59 AM End time: 03/25/2019 11:04 AM Injection made incrementally with aspirations every 5 mL.  Performed by: Personally  Anesthesiologist: Josephine Igo, MD  Additional Notes: Timeout performed. Patient sedated. Relevant anatomy ID'd using [place]. Incremental 2-39ml injection of LA with frequent aspiration. Patient tolerated procedure well.        Left Adductor Canal Block

## 2019-03-25 NOTE — Discharge Instructions (Addendum)
Dr. Gaynelle Arabian Total Joint Specialist [facility] [address], [address] [place], [place] [address] [phone]    TOTAL KNEE REPLACEMENT POSTOPERATIVE DIRECTIONS    Knee Rehabilitation, Guidelines Following Surgery  Results after knee surgery are often greatly improved when you follow the exercise, range of motion and muscle strengthening exercises prescribed by your doctor. Safety measures are also important to protect the knee from further injury. If any of these exercises cause you to have increased pain or swelling in your knee joint, decrease the amount until you are comfortable again and slowly increase them. If you have problems or questions, call your caregiver or physical therapist for advice.   BLOOD CLOT PREVENTION . Take a 325 mg Aspirin two times a day for three weeks following surgery. Then take an 81 mg Aspirin once a day for three weeks. Then discontinue Aspirin. Dennis Bast may resume your vitamins/supplements upon discharge from the hospital. . You may supplement your pain medications with tylenol if needed. Do not take any NSAIDs (Advil, Aleve, Ibuprofen, Meloxicam, etc.) until you have discontinued the 325 mg Aspirin.   HOME CARE INSTRUCTIONS  . Remove items at home which could result in a fall. This includes throw rugs or furniture in walking pathways.   ICE to the affected knee as much as tolerated. Icing helps control swelling. If the swelling is well controlled you will be more comfortable and rehab easier. Continue to use ice on the knee for pain and swelling from surgery. You may notice swelling that will progress down to the foot and ankle. This is normal after surgery. Elevate the leg when you are not up walking on it.    Continue to use the breathing machine which will help keep your temperature down.  It is common for your temperature to cycle up and down following surgery, especially at night when you are not up moving around and exerting yourself.  The  breathing machine keeps your lungs expanded and your temperature down.  Do not place pillow under knee, focus on keeping the knee straight while resting  PAIN CONTROL Achieving adequate pain control can be challenging in the first 48-72 hours after the nerve block wears off. During this time it is best to stay ahead of the pain by taking your prescribed pain meds every 4-6 hours. Once the pain is well controlled (you will not be pain free but the goal is having it under control) you may begin slowly weaning off the medications  DIET You may resume your previous home diet once you are discharged from the hospital.  DRESSING / [place] / SHOWERING . Keep your bulky bandage on for 2 days. On the third post-operative day you may remove the Ace bandage, cotton and gauze,. There is a waterproof adhesive bandage on your skin which will stay on for 7-10 days. Once you remove this you will not need to place another bandage . You may begin showering 3 days after surgery. . Do not submerge the incision under water.  ACTIVITY For the first 5 days the key is rest and control of pain and swelling . You should rest, ice and elevate the leg for 50 minutes out of every hour. Get up and walk/stretch for 10 minutes per hour. After 5 days you can increase your activity slowly as tolerated . Walk with your walker as instructed. Use the walker until you are comfortable transitioning to a cane. Walk with the cane in the opposite hand of the operative  leg. You may discontinue the cane once you are comfortable. . Avoid periods of inactivity such as sitting longer than an hour when not asleep. This helps prevent blood clots.  . Do your home exercises twice a day starting on post-operative day 3. On the days you go to physical therapy, just do the home exercises once that day. . Do not drive a car until released by your surgeon.  . Do not drive while taking narcotics.  TED HOSE STOCKINGS Wear the elastic stockings  on both legs for three weeks following surgery during the day. You may remove them at night for sleeping.  WEIGHT BEARING You may bear weight as tolerated on the operative leg.  POSTOPERATIVE CONSTIPATION PROTOCOL Constipation - defined medically as fewer than three stools per week and severe constipation as less than one stool per week.  One of the most common issues patients have following surgery is constipation.  Even if you have a regular bowel pattern at home, your normal regimen is likely to be disrupted due to multiple reasons following surgery.  Combination of anesthesia, postoperative narcotics, change in appetite and fluid intake all can affect your bowels.  In order to avoid complications following surgery, here are some recommendations in order to help you during your recovery period.  Colace (docusate) - Pick up an over-the-counter form of Colace or another stool softener and take twice a day as long as you are requiring postoperative pain medications.  Take with a full glass of water daily.  If you experience loose stools or diarrhea, hold the colace until you stool forms back up.  If your symptoms do not get better within 1 week or if they get worse, check with your doctor.  MiraLax (polyethylene glycol) - Pick up over-the-counter to have on hand.  MiraLax is a solution that will increase the amount of water in your bowels to assist with bowel movements.  Take as directed and can mix with a glass of water, juice, soda, coffee, or tea.  Take if you go more than two days without a movement. Do not use MiraLax more than once per day. Call your doctor if you are still constipated or irregular after using this medication for 7 days in a row.  If you continue to have problems with postoperative constipation, please contact the office for further assistance and recommendations.  If you experience "the worst abdominal pain ever" or develop nausea or vomiting, please contact the office  immediatly for further recommendations for treatment.  ITCHING  If you experience itching with your medications, try taking only a single pain pill, or even half a pain pill at a time.  You can also use Benadryl over the counter for itching or also to help with sleep.   MEDICATIONS See your medication summary on the "After Visit Summary" that the nursing staff will review with you prior to discharge.  You may have some home medications which will be placed on hold until you complete the course of blood thinner medication.  It is important for you to complete the blood thinner medication as prescribed by your surgeon.  Continue your approved medications as instructed at time of discharge.  PRECAUTIONS If you experience chest pain or shortness of breath - call 911 immediately for transfer to the hospital emergency department.  If you develop a fever greater that 101 F, purulent drainage from wound, increased redness or drainage from wound, foul odor from the wound/dressing, or calf pain - CONTACT YOUR SURGEON.  FOLLOW-UP APPOINTMENTS Make sure you keep all of your appointments after your operation with your surgeon and caregivers. You should call the office at the above phone number and make an appointment for approximately two weeks after the date of your surgery or on the date instructed by your surgeon outlined in the "After Visit Summary".  MAKE SURE YOU:  . Understand these instructions.  . Get help right away if you are not doing well or get worse.    Pick up stool softner and laxative for home use following surgery while on pain medications. May shower starting three days after surgery. Please use a clean towel to pat the incision dry following showers. Continue to use ice for pain and swelling after surgery. Do not use any lotions or creams on the incision until instructed by your surgeon.

## 2019-03-25 NOTE — Care Plan (Signed)
Ortho Bundle Case Management Note  Patient Details  Name: [name] MRN: [id number] Date of Birth: 17-Jan-1951  L TKA on 03-25-19 DCP:  Home with spouse.  1 story home with 2 ste.  DME:  Has a RW.  3-in-1 ordered through [place]. PT:  EO.  PT eval scheduled on 03-27-19                    DME Arranged:  3-N-1 DME Agency:  [employer]  HH Arranged:  NA HH Agency:  NA  Additional Comments: Please contact me with any questions of if this plan should need to change.  Marianne Sofia, RN,CCM [facility]  [phone] 03/25/2019, 2:12 PM

## 2019-03-25 NOTE — Anesthesia Postprocedure Evaluation (Addendum)
Anesthesia Post Note  Patient: [name]  Procedure(s) Performed: TOTAL KNEE ARTHROPLASTY (Left Knee)     Patient location during evaluation: PACU Anesthesia Type: General Level of consciousness: oriented and awake and alert Pain management: pain level controlled Vital Signs Assessment: post-procedure vital signs reviewed and stable Respiratory status: respiratory function stable, nonlabored ventilation and spontaneous breathing Cardiovascular status: blood pressure returned to baseline and stable Postop Assessment: no apparent nausea or vomiting Anesthetic complications: no    Last Vitals:  Vitals:   03/25/19 1430 03/25/19 1500  BP: (!) 153/88 122/85  Pulse: 78 (!) 53  Resp: 15 15  Temp: 36.6 C   SpO2: 100% 100%    Last Pain:  Vitals:   03/25/19 1500  TempSrc:   PainSc: 5                  Destanee Bedonie A.

## 2019-03-25 NOTE — Op Note (Addendum)
OPERATIVE REPORT-TOTAL KNEE ARTHROPLASTY   Pre-operative diagnosis- Osteoarthritis  Left knee(s)  Post-operative diagnosis- Osteoarthritis Left knee(s)  Procedure-  Left  Total Knee Arthroplasty (Depuy Attune)  Surgeon- [name]. Lear Carstens, MD  Assistant- Theresa Duty, PA-C   Anesthesia- Adductor canal block and general  EBL-50 mL   Drains None  Tourniquet time-  Total Tourniquet Time Documented: Thigh (Left) - 42 minutes Total: Thigh (Left) - 42 minutes     Complications- None  Condition-PACU - hemodynamically stable.   Brief Clinical Note   [name] is a 69 y.o. year old male with end stage OA of his left knee with progressively worsening pain and dysfunction. He has constant pain, with activity and at rest and significant functional deficits with difficulties even with ADLs. He has had extensive non-op management including analgesics, injections of cortisone and viscosupplements, and home exercise program, but remains in significant pain with significant dysfunction. Radiographs show bone on bone arthritis medial and patellofemoral. He presents now for left Total Knee Arthroplasty.    Procedure in detail---   The patient is brought into the operating room and positioned supine on the operating table. After successful administration of  Adductor canal block and general,   a tourniquet is placed high on the  Left thigh(s) and the lower extremity is prepped and draped in the usual sterile fashion. Time out is performed by the operating team and then the  Left lower extremity is wrapped in Esmarch, knee flexed and the tourniquet inflated to 300 mmHg.       A midline incision is made with a ten blade through the subcutaneous tissue to the level of the extensor mechanism. A fresh blade is used to make a medial parapatellar arthrotomy. Soft tissue over the proximal medial tibia is subperiosteally elevated to the joint line with a knife and into the semimembranosus bursa  with a Cobb elevator. Soft tissue over the proximal lateral tibia is elevated with attention being paid to avoiding the patellar tendon on the tibial tubercle. The patella is everted, knee flexed 90 degrees and the ACL and PCL are removed. Findings are bone on bone medial and patellofemoral with large global osteophytes.      The drill is used to create a starting hole in the distal femur and the canal is thoroughly irrigated with sterile saline to remove the fatty contents. The 5 degree Left  valgus alignment guide is placed into the femoral canal and the distal femoral cutting block is pinned to remove 9 mm off the distal femur. Resection is made with an oscillating saw.      The tibia is subluxed forward and the menisci are removed. The extramedullary alignment guide is placed referencing proximally at the medial aspect of the tibial tubercle and distally along the second metatarsal axis and tibial crest. The block is pinned to remove 92mm off the more deficient medial  side. Resection is made with an oscillating saw. Size 8is the most appropriate size for the tibia and the proximal tibia is prepared with the modular drill and keel punch for that size.      The femoral sizing guide is placed and size 9 is most appropriate. Rotation is marked off the epicondylar axis and confirmed by creating a rectangular flexion gap at 90 degrees. The size 9 cutting block is pinned in this rotation and the anterior, posterior and chamfer cuts are made with the oscillating saw. The intercondylar block is then placed and that cut is made.  Trial size 8 tibial component, trial size 9 posterior stabilized femur and a 8  mm posterior stabilized rotating platform insert trial is placed. Full extension is achieved with excellent varus/valgus and anterior/posterior balance throughout full range of motion. The patella is everted and thickness measured to be 27  mm. Free hand resection is taken to 15 mm, a 41 template is placed,  lug holes are drilled, trial patella is placed, and it tracks normally. Osteophytes are removed off the posterior femur with the trial in place. All trials are removed and the cut bone surfaces prepared with pulsatile lavage. Cement is mixed and once ready for implantation, the size 8 tibial implant, size  9 posterior stabilized femoral component, and the size 41 patella are cemented in place and the patella is held with the clamp. The trial insert is placed and the knee held in full extension. The Exparel (20 ml mixed with 60 ml saline) is injected into the extensor mechanism, posterior capsule, medial and lateral gutters and subcutaneous tissues.  All extruded cement is removed and once the cement is hard the permanent 8 mm posterior stabilized rotating platform insert is placed into the tibial tray.      The wound is copiously irrigated with saline solution and the extensor mechanism closed over a hemovac drain with #1 V-loc suture. The tourniquet is released for a total tourniquet time of 42  minutes. Flexion against gravity is 140 degrees and the patella tracks normally. Subcutaneous tissue is closed with 2.0 vicryl and subcuticular with running 4.0 Monocryl. The incision is cleaned and dried and steri-strips and a bulky sterile dressing are applied. The limb is placed into a knee immobilizer and the patient is awakened and transported to recovery in stable condition.      Please note that a surgical assistant was a medical necessity for this procedure in order to perform it in a safe and expeditious manner. Surgical assistant was necessary to retract the ligaments and vital neurovascular structures to prevent injury to them and also necessary for proper positioning of the limb to allow for anatomic placement of the prosthesis.   [name] Brian Moten, MD    03/25/2019, 1:45 PM

## 2019-03-25 NOTE — Anesthesia Preprocedure Evaluation (Addendum)
Anesthesia Evaluation  Patient identified by MRN, date of birth, ID band Patient awake    Reviewed: Allergy & Precautions, NPO status , Patient's Chart, lab work & pertinent test results  Airway Mallampati: II  TM Distance: >3 FB Neck ROM: Limited  Mouth opening: Limited Mouth Opening  Dental no notable dental hx. (+) Teeth Intact   Pulmonary former smoker,    Pulmonary exam normal breath sounds clear to auscultation       Cardiovascular hypertension, Pt. on medications Normal cardiovascular exam+ dysrhythmias Atrial Fibrillation  Rhythm:Regular Rate:Normal     Neuro/Psych PSYCHIATRIC DISORDERS negative neurological ROS     GI/Hepatic negative GI ROS, (+)     substance abuse  alcohol use,   Endo/Other  Hyperlipidemia Obesity  Renal/GU negative Renal ROS   ED    Musculoskeletal  (+) Arthritis , Osteoarthritis,  Left knee OA   Abdominal (+) + obese,   Peds  Hematology negative hematology ROS (+)   Anesthesia Other Findings   Reproductive/Obstetrics                           Anesthesia Physical Anesthesia Plan  ASA: II  Anesthesia Plan: Spinal   Post-op Pain Management:  Regional for Post-op pain   Induction: Intravenous  PONV Risk Score and Plan: 2 and Ondansetron, Propofol infusion and Treatment may vary due to age or medical condition  Airway Management Planned: Natural Airway, Nasal Cannula and Simple Face Mask  Additional Equipment:   Intra-op Plan:   Post-operative Plan: Extubation in OR  Informed Consent: I have reviewed the patients History and Physical, chart, labs and discussed the procedure including the risks, benefits and alternatives for the proposed anesthesia with the patient or authorized representative who has indicated his/her understanding and acceptance.     Dental advisory given  Plan Discussed with: CRNA and Surgeon  Anesthesia Plan Comments:  (Possible difficult intubation )       Anesthesia Quick Evaluation

## 2019-03-25 NOTE — Anesthesia Procedure Notes (Signed)
Procedure Name: LMA Insertion Date/Time: 03/25/2019 12:39 PM Performed by: Victoriano Lain, CRNA Pre-anesthesia Checklist: Patient identified, Emergency Drugs available, Suction available, Patient being monitored and Timeout performed Patient Re-evaluated:Patient Re-evaluated prior to induction Oxygen Delivery Method: Circle system utilized Preoxygenation: Pre-oxygenation with 100% oxygen Induction Type: IV induction LMA: LMA inserted LMA Size: 5.0 Number of attempts: 2 Placement Confirmation: positive ETCO2 and breath sounds checked- equal and bilateral Tube secured with: Tape Dental Injury: Teeth and Oropharynx as per pre-operative assessment  Comments: Unable to place spinal after multiple attempts by CRNA and Dr Royce Macadamia. Converted to general anesthesia. Pt noted to have limited mouth opening and decreased range of motion with neck. Able to place lubricated #5 LMA on the second attempt. + ETCO2. LMA secured in place.

## 2019-03-25 NOTE — Addendum Note (Signed)
Addendum  created 03/25/19 1547 by Josephine Igo, MD   Clinical Note Signed

## 2019-03-25 NOTE — Transfer of Care (Signed)
Immediate Anesthesia Transfer of Care Note  Patient: [name]  Procedure(s) Performed: TOTAL KNEE ARTHROPLASTY (Left Knee)  Patient Location: PACU  Anesthesia Type:General  Level of Consciousness: awake, alert , oriented and patient cooperative  Airway & Oxygen Therapy: Patient Spontanous Breathing, Patient connected to face mask oxygen and Patient connected to face mask  Post-op Assessment: Report given to RN, Post -op Vital signs reviewed and stable and Patient moving all extremities  Post vital signs: Reviewed and stable  Last Vitals:  Vitals Value Taken Time  BP 172/111 03/25/19 1430  Temp    Pulse 76 03/25/19 1433  Resp 11 03/25/19 1433  SpO2 100 % 03/25/19 1433  Vitals shown include unvalidated device data.  Last Pain:  Vitals:   03/25/19 1142  TempSrc:   PainSc: 0-No pain      Patients Stated Pain Goal: 3 ([id number])  Complications: No apparent anesthesia complications

## 2019-03-25 NOTE — Progress Notes (Signed)
AssistedDr. Foster with left, ultrasound guided, adductor canal block. Side rails up, monitors on throughout procedure. See vital signs in flow sheet. Tolerated Procedure well.  

## 2019-03-26 ENCOUNTER — Encounter: Payer: Self-pay | Admitting: Family Medicine

## 2019-03-26 ENCOUNTER — Encounter: Payer: Self-pay | Admitting: *Deleted

## 2019-03-26 NOTE — Addendum Note (Signed)
Addendum  created 03/26/19 1630 by Josephine Igo, MD   Clinical Note Signed, Intraprocedure Blocks edited

## 2019-03-27 ENCOUNTER — Other Ambulatory Visit: Payer: Self-pay | Admitting: Family Medicine

## 2019-03-27 DIAGNOSIS — R748 Abnormal levels of other serum enzymes: Secondary | ICD-10-CM

## 2019-03-29 ENCOUNTER — Other Ambulatory Visit: Payer: Medicare Other

## 2019-03-29 DIAGNOSIS — M25562 Pain in left knee: Secondary | ICD-10-CM | POA: Diagnosis not present

## 2019-04-05 DIAGNOSIS — M25562 Pain in left knee: Secondary | ICD-10-CM | POA: Diagnosis not present

## 2019-04-09 DIAGNOSIS — M25562 Pain in left knee: Secondary | ICD-10-CM | POA: Diagnosis not present

## 2019-04-09 MED ORDER — DILTIAZEM HCL ER COATED BEADS 180 MG PO CP24: 180.0000 mg | ORAL_CAPSULE | Freq: Every day | ORAL | 3 refills | Status: DC

## 2019-04-10 ENCOUNTER — Encounter: Payer: Self-pay | Admitting: Family Medicine

## 2019-04-11 DIAGNOSIS — M25562 Pain in left knee: Secondary | ICD-10-CM | POA: Diagnosis not present

## 2019-04-15 DIAGNOSIS — M25562 Pain in left knee: Secondary | ICD-10-CM | POA: Diagnosis not present

## 2019-04-18 DIAGNOSIS — M25562 Pain in left knee: Secondary | ICD-10-CM | POA: Diagnosis not present

## 2019-04-23 DIAGNOSIS — M25562 Pain in left knee: Secondary | ICD-10-CM | POA: Diagnosis not present

## 2019-04-24 ENCOUNTER — Encounter: Payer: Self-pay | Admitting: Family Medicine

## 2019-04-26 DIAGNOSIS — M25562 Pain in left knee: Secondary | ICD-10-CM | POA: Diagnosis not present

## 2019-04-30 DIAGNOSIS — M25562 Pain in left knee: Secondary | ICD-10-CM | POA: Diagnosis not present

## 2019-05-01 DIAGNOSIS — Z96652 Presence of left artificial knee joint: Secondary | ICD-10-CM | POA: Diagnosis not present

## 2019-05-07 DIAGNOSIS — M25562 Pain in left knee: Secondary | ICD-10-CM | POA: Diagnosis not present

## 2019-05-09 ENCOUNTER — Encounter: Payer: Self-pay | Admitting: Family Medicine

## 2019-05-10 ENCOUNTER — Other Ambulatory Visit: Payer: Self-pay

## 2019-05-10 ENCOUNTER — Other Ambulatory Visit (INDEPENDENT_AMBULATORY_CARE_PROVIDER_SITE_OTHER): Payer: Medicare Other

## 2019-05-10 DIAGNOSIS — Z23 Encounter for immunization: Secondary | ICD-10-CM | POA: Diagnosis not present

## 2019-05-10 DIAGNOSIS — R748 Abnormal levels of other serum enzymes: Secondary | ICD-10-CM

## 2019-05-10 LAB — HEPATIC FUNCTION PANEL
ALT: 36 U/L (ref 0–53)
AST: 29 U/L (ref 0–37)
Albumin: 4.3 g/dL (ref 3.5–5.2)
Alkaline Phosphatase: 98 U/L (ref 39–117)
Bilirubin, Direct: 0 mg/dL (ref 0.0–0.3)
Total Bilirubin: 0.6 mg/dL (ref 0.2–1.2)
Total Protein: 7.4 g/dL (ref 6.0–8.3)

## 2019-05-13 DIAGNOSIS — M25562 Pain in left knee: Secondary | ICD-10-CM | POA: Diagnosis not present

## 2019-05-16 DIAGNOSIS — M25562 Pain in left knee: Secondary | ICD-10-CM | POA: Diagnosis not present

## 2019-05-21 DIAGNOSIS — M25562 Pain in left knee: Secondary | ICD-10-CM | POA: Diagnosis not present

## 2019-05-24 DIAGNOSIS — M25562 Pain in left knee: Secondary | ICD-10-CM | POA: Diagnosis not present

## 2019-05-30 ENCOUNTER — Other Ambulatory Visit: Payer: Self-pay | Admitting: Internal Medicine

## 2019-05-31 DIAGNOSIS — M25562 Pain in left knee: Secondary | ICD-10-CM | POA: Diagnosis not present

## 2019-06-07 DIAGNOSIS — Z23 Encounter for immunization: Secondary | ICD-10-CM | POA: Diagnosis not present

## 2019-06-25 ENCOUNTER — Ambulatory Visit: Payer: Medicare Other | Admitting: Dermatology

## 2019-07-16 ENCOUNTER — Ambulatory Visit: Payer: Medicare Other | Admitting: Dermatology

## 2019-07-22 ENCOUNTER — Other Ambulatory Visit: Payer: Self-pay

## 2019-07-22 ENCOUNTER — Encounter: Payer: Self-pay | Admitting: Internal Medicine

## 2019-07-22 ENCOUNTER — Ambulatory Visit (INDEPENDENT_AMBULATORY_CARE_PROVIDER_SITE_OTHER): Payer: Medicare Other | Admitting: Internal Medicine

## 2019-07-22 VITALS — BP 128/68 | HR 77 | Ht 72.0 in | Wt 229.4 lb

## 2019-07-22 DIAGNOSIS — I471 Supraventricular tachycardia: Secondary | ICD-10-CM
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
## 2019-07-22 NOTE — Patient Instructions (Signed)

## 2019-12-20 DIAGNOSIS — Z23 Encounter for immunization: Secondary | ICD-10-CM | POA: Diagnosis not present

## 2020-01-16 DIAGNOSIS — M1711 Unilateral primary osteoarthritis, right knee: Secondary | ICD-10-CM | POA: Diagnosis not present

## 2020-01-23 DIAGNOSIS — M1711 Unilateral primary osteoarthritis, right knee: Secondary | ICD-10-CM | POA: Diagnosis not present

## 2020-01-28 DIAGNOSIS — Z20822 Contact with and (suspected) exposure to covid-19: Secondary | ICD-10-CM | POA: Diagnosis not present

## 2020-01-30 ENCOUNTER — Telehealth: Payer: Self-pay | Admitting: Family Medicine

## 2020-01-30 NOTE — Telephone Encounter (Signed)
Patient states dx with covid 47 yesterday  And would like Dr Nani Ravens to know.

## 2020-01-31 ENCOUNTER — Encounter: Payer: Medicare Other | Admitting: Family Medicine

## 2020-02-03 ENCOUNTER — Encounter: Payer: Self-pay | Admitting: Family Medicine

## 2020-02-03 ENCOUNTER — Telehealth (HOSPITAL_COMMUNITY): Payer: Self-pay

## 2020-02-03 ENCOUNTER — Telehealth (INDEPENDENT_AMBULATORY_CARE_PROVIDER_SITE_OTHER): Payer: Medicare Other | Admitting: Family Medicine

## 2020-02-03 ENCOUNTER — Other Ambulatory Visit: Payer: Self-pay

## 2020-02-03 ENCOUNTER — Other Ambulatory Visit (HOSPITAL_COMMUNITY): Payer: Self-pay

## 2020-02-03 VITALS — Temp 100.0°F

## 2020-02-03 DIAGNOSIS — U071 COVID-19: Secondary | ICD-10-CM

## 2020-02-03 MED ORDER — BENZONATATE 100 MG PO CAPS: 100.0000 mg | ORAL_CAPSULE | Freq: Three times a day (TID) | ORAL | 0 refills | Status: DC | PRN

## 2020-02-03 MED ORDER — PREDNISONE 20 MG PO TABS: 40.0000 mg | ORAL_TABLET | Freq: Every day | ORAL | 0 refills | Status: AC

## 2020-02-03 NOTE — Telephone Encounter (Signed)
Called to Discuss with patient about Covid symptoms and the use of the monoclonal antibody infusion for those with mild to moderate Covid symptoms and at a high risk of hospitalization.     Pt appears to qualify for this infusion due to co-morbid conditions and/or a member of an at-risk group in accordance with the FDA Emergency Use Authorization.    Unable to reach pt   Left voicemail to return call at [phone]

## 2020-02-03 NOTE — Telephone Encounter (Signed)
Called to discuss with patient about Covid symptoms and the use of the monoclonal antibody infusion for those with mild to moderate Covid symptoms and at a high risk of hospitalization.     Pt appears to qualify for this infusion due to co-morbid conditions and/or a member of an at-risk group in accordance with the FDA Emergency Use Authorization.    Risk factors include: Age over 33, HTN   Symptom onset: 11/15. Aches, chills, runny nose and fever.    Tested positive for COVID 19: 11/17, results in media    Discussed information regarding costs of monoclonal antibody treatment, given both CPT & REV codes, and encouraged patient to call their health insurance company to verify cost of treatment that pt will be financially responsible for.  Patient aware they will receive a call from APP for further information and to schedule appointment. All questions answered.

## 2020-02-03 NOTE — Progress Notes (Signed)
Chief Complaint  Patient presents with  . Covid Positive  . Cough  . Nasal Congestion  . Chills  . Fever    Stepehn Eckard Gornick here for URI complaints. Due to COVID-19 pandemic, we are interacting via web portal for an electronic face-to-face visit. I verified patient's ID using 2 identifiers. Patient agreed to proceed with visit via this method. Patient is at home, I am at office. Patient and I are present for visit.   Duration: 7 days  Associated symptoms: Fever (101.8 F), sinus congestion, rhinorrhea, ear fullness, sore throat, shortness of breath, wheezing, myalgia and cough Denies: sinus pain, itchy watery eyes, ear drainage, shortness of breath and N/V/D Treatment to date: Robitussin, Vick's, Nyquil Sick contacts: Yes- son tested positive for covid Started having s/s's and tested for covid on 11/15. He received a positive result on 11/17.   Past Medical History:  Diagnosis Date  . Alcohol abuse   . Hemorrhoids   . Hx of colonic polyps   . Hyperlipidemia   . Hypertension   . Psoriasis    left leg , down to ankle ; applies oint ment   . PSVT (paroxysmal supraventricular tachycardia) ([place])   . RBBB (right bundle branch block)   . Whole blood donor     Temp 100 F (37.8 C) (Oral)  No conversational dyspnea Age appropriate judgment and insight Nml affect and mood  COVID-19 - Plan: predniSONE (DELTASONE) 20 MG tablet, benzonatate (TESSALON) 100 MG capsule  Will set him up with infusion clinic if possible. Pred burst for 5 d, 40 mg/d. Tessalon Perles prn.  Continue to push fluids, practice good hand hygiene, cover mouth when coughing. F/u prn. If starting to experience fevers, shaking, or shortness of breath, seek immediate care. Pt voiced understanding and agreement to the plan.  [place], DO 02/03/20 3:27 PM

## 2020-02-04 ENCOUNTER — Ambulatory Visit (HOSPITAL_COMMUNITY)
Admission: RE | Admit: 2020-02-04 | Discharge: 2020-02-04 | Disposition: A | Discharge status: Home / Self Care | Payer: Medicare Other | Source: Ambulatory Visit | Attending: Pulmonary Disease | Admitting: Pulmonary Disease

## 2020-02-04 ENCOUNTER — Other Ambulatory Visit: Payer: Self-pay | Admitting: Family

## 2020-02-04 DIAGNOSIS — U071 COVID-19: Secondary | ICD-10-CM | POA: Diagnosis not present

## 2020-02-04 DIAGNOSIS — I10 Essential (primary) hypertension: Secondary | ICD-10-CM | POA: Diagnosis not present

## 2020-02-04 DIAGNOSIS — Z23 Encounter for immunization: Secondary | ICD-10-CM | POA: Insufficient documentation

## 2020-02-04 DIAGNOSIS — R54 Age-related physical debility: Secondary | ICD-10-CM | POA: Insufficient documentation

## 2020-02-04 MED ORDER — METHYLPREDNISOLONE SODIUM SUCC 125 MG IJ SOLR: 125.0000 mg | Freq: Once | INTRAMUSCULAR | Status: DC | PRN

## 2020-02-04 MED ORDER — FAMOTIDINE IN NACL 20-0.9 MG/50ML-% IV SOLN: 20.0000 mg | Freq: Once | INTRAVENOUS | Status: DC | PRN

## 2020-02-04 MED ORDER — EPINEPHRINE 0.3 MG/0.3ML IJ SOAJ: 0.3000 mg | Freq: Once | INTRAMUSCULAR | Status: DC | PRN

## 2020-02-04 MED ORDER — DIPHENHYDRAMINE HCL 50 MG/ML IJ SOLN: 50.0000 mg | Freq: Once | INTRAMUSCULAR | Status: DC | PRN

## 2020-02-04 MED ORDER — SODIUM CHLORIDE 0.9 % IV SOLN: INTRAVENOUS | Status: DC | PRN

## 2020-02-04 MED ORDER — ALBUTEROL SULFATE HFA 108 (90 BASE) MCG/ACT IN AERS: 2.0000 | INHALATION_SPRAY | Freq: Once | RESPIRATORY_TRACT | Status: DC | PRN

## 2020-02-04 MED ORDER — SOTROVIMAB 500 MG/8ML IV SOLN
500.0000 mg | Freq: Once | INTRAVENOUS | Status: AC
  Administered 2020-02-04: 500 mg via INTRAVENOUS

## 2020-02-04 NOTE — Discharge Instructions (Signed)

## 2020-02-04 NOTE — Progress Notes (Signed)
Diagnosis: COVID-19  Physician: Dr. Patrick Wright  Procedure: Covid Infusion Clinic Med: Sotrovimab infusion - Provided patient with sotrovimab fact sheet for patients, parents, and caregivers prior to infusion.   Complications: No immediate complications noted  Discharge: Discharged home  If after the infusion you have any questions or concerns please call the Advanced Practice Provider at [phone] 

## 2020-02-04 NOTE — Progress Notes (Signed)
Patient reviewed Fact Sheet for Patients, Parents, and Caregivers for Emergency Use Authorization (EUA) of Sotrovimab for the Treatment of Coronavirus. Patient also reviewed and is agreeable to the estimated cost of treatment. Patient is agreeable to proceed.   

## 2020-02-04 NOTE — Telephone Encounter (Signed)
I connected by phone with [name] on 02/04/2020 at 9:08 AM to discuss the potential use of a new treatment for mild to moderate COVID-19 viral infection in non-hospitalized patients.  This patient is a 69 y.o. male that meets the FDA criteria for Emergency Use Authorization of COVID monoclonal antibody casirivimab/imdevimab, bamlanivimab/eteseviamb, or sotrovimab.  Has a (+) direct SARS-CoV-2 viral test result  Has mild or moderate COVID-19   Is NOT hospitalized due to COVID-19  Is within 10 days of symptom onset  Has at least one of the high risk factor(s) for progression to severe COVID-19 and/or hospitalization as defined in EUA.  Specific high risk criteria : Older age (>/= 69 yo) and Cardiovascular disease or hypertension   I have spoken and communicated the following to the patient or parent/caregiver regarding COVID monoclonal antibody treatment:  1. FDA has authorized the emergency use for the treatment of mild to moderate COVID-19 in adults and pediatric patients with positive results of direct SARS-CoV-2 viral testing who are 71 years of age and older weighing at least 40 kg, and who are at high risk for progressing to severe COVID-19 and/or hospitalization.  2. The significant known and potential risks and benefits of COVID monoclonal antibody, and the extent to which such potential risks and benefits are unknown.  3. Information on available alternative treatments and the risks and benefits of those alternatives, including clinical trials.  4. Patients treated with COVID monoclonal antibody should continue to self-isolate and use infection control measures (e.g., wear mask, isolate, social distance, avoid sharing personal items, clean and disinfect "high touch" surfaces, and frequent handwashing) according to CDC guidelines.   5. The patient or parent/caregiver has the option to accept or refuse COVID monoclonal antibody treatment.  After reviewing this  information with the patient, the patient has agreed to receive one of the available covid 19 monoclonal antibodies and will be provided an appropriate fact sheet prior to infusion. Loel Dubonnet, NP 02/04/2020 9:08 AM

## 2020-02-11 ENCOUNTER — Telehealth: Payer: Self-pay | Admitting: Family Medicine

## 2020-02-11 ENCOUNTER — Encounter: Payer: Self-pay | Admitting: Family Medicine

## 2020-02-11 NOTE — Telephone Encounter (Signed)
Patient states he had tickets to [place] and could not go b/c of covid dx and in order to get his money back .... he needs a note for Dr Nani Ravens stating that he was in quarantine until 01/31/2020

## 2020-02-11 NOTE — Telephone Encounter (Signed)
Patient states he got his dates wrong... it should be start 01/29/20 last day 02/08/20

## 2020-02-11 NOTE — Telephone Encounter (Signed)
Let me know exact dates//can do letter

## 2020-02-11 NOTE — Telephone Encounter (Signed)
Letter done///sent through mychart///patient informed

## 2020-02-11 NOTE — Telephone Encounter (Signed)
11/15 was start of s/s's and he would quarantine until 11/24 as his last day.

## 2020-02-11 NOTE — Telephone Encounter (Signed)
Letter done and sent through my chart. Patient informed

## 2020-02-13 DIAGNOSIS — M1711 Unilateral primary osteoarthritis, right knee: Secondary | ICD-10-CM | POA: Diagnosis not present

## 2020-02-14 ENCOUNTER — Other Ambulatory Visit: Payer: Self-pay | Admitting: Internal Medicine

## 2020-02-17 ENCOUNTER — Ambulatory Visit (INDEPENDENT_AMBULATORY_CARE_PROVIDER_SITE_OTHER): Payer: Medicare Other | Admitting: Family Medicine

## 2020-02-17 ENCOUNTER — Other Ambulatory Visit: Payer: Self-pay | Admitting: Family Medicine

## 2020-02-17 ENCOUNTER — Encounter: Payer: Self-pay | Admitting: Family Medicine

## 2020-02-17 ENCOUNTER — Other Ambulatory Visit: Payer: Self-pay

## 2020-02-17 VITALS — BP 122/82 | HR 62 | Temp 98.3°F | Ht 72.0 in | Wt 221.4 lb

## 2020-02-17 DIAGNOSIS — N529 Male erectile dysfunction, unspecified: Secondary | ICD-10-CM

## 2020-02-17 DIAGNOSIS — Z125 Encounter for screening for malignant neoplasm of prostate: Secondary | ICD-10-CM

## 2020-02-17 DIAGNOSIS — E782 Mixed hyperlipidemia: Secondary | ICD-10-CM

## 2020-02-17 DIAGNOSIS — Z0000 Encounter for general adult medical examination without abnormal findings: Secondary | ICD-10-CM | POA: Diagnosis not present

## 2020-02-17 DIAGNOSIS — J3489 Other specified disorders of nose and nasal sinuses: Secondary | ICD-10-CM

## 2020-02-17 DIAGNOSIS — R040 Epistaxis: Secondary | ICD-10-CM | POA: Diagnosis not present

## 2020-02-17 LAB — COMPREHENSIVE METABOLIC PANEL
ALT: 42 U/L (ref 0–53)
AST: 25 U/L (ref 0–37)
Albumin: 4.2 g/dL (ref 3.5–5.2)
Alkaline Phosphatase: 77 U/L (ref 39–117)
BUN: 9 mg/dL (ref 6–23)
CO2: 32 mEq/L (ref 19–32)
Calcium: 8.9 mg/dL (ref 8.4–10.5)
Chloride: 99 mEq/L (ref 96–112)
Creatinine, Ser: 0.9 mg/dL (ref 0.40–1.50)
GFR: 87.12 mL/min (ref >60.00)
Glucose, Bld: 100 mg/dL — ABNORMAL HIGH (ref 70–99)
Potassium: 4.8 mEq/L (ref 3.5–5.1)
Sodium: 140 mEq/L (ref 135–145)
Total Bilirubin: 0.8 mg/dL (ref 0.2–1.2)
Total Protein: 6.8 g/dL (ref 6.0–8.3)

## 2020-02-17 LAB — PSA, MEDICARE: PSA: 0.83 ng/ml (ref 0.10–4.00)

## 2020-02-17 LAB — LIPID PANEL
Cholesterol: 154 mg/dL (ref 0–200)
HDL: 46.9 mg/dL (ref >39.00)
LDL Cholesterol: 74 mg/dL (ref 0–99)
NonHDL: 107.09
Total CHOL/HDL Ratio: 3
Triglycerides: 167 mg/dL — ABNORMAL HIGH (ref 0.0–149.0)
VLDL: 33.4 mg/dL (ref 0.0–40.0)

## 2020-02-17 MED ORDER — PREDNISONE 20 MG PO TABS: 40.0000 mg | ORAL_TABLET | Freq: Every day | ORAL | 0 refills | Status: AC

## 2020-02-17 MED ORDER — SILDENAFIL CITRATE 100 MG PO TABS: 50.0000 mg | ORAL_TABLET | Freq: Every day | ORAL | 3 refills | Status: DC | PRN
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
## 2020-02-17 NOTE — Patient Instructions (Addendum)
You can do free hearing screenings at [facility]. If you need a referral to an audiologist please let me know.    The new Shingrix vaccine (for shingles) is a 2 shot series. It can make people feel low energy, achy and almost like they have the flu for 48 hours after injection. Please plan accordingly when deciding on when to get this shot. Call your pharmacy to inquire about this. The second shot of the series is less severe regarding the side effects, but it still lasts 48 hours.   Claritin (loratadine), Allegra (fexofenadine), Zyrtec (cetirizine) which is also equivalent to Xyzal (levocetirizine); these are listed in order from weakest to strongest. Generic, and therefore cheaper, options are in the parentheses.   Flonase (fluticasone); nasal spray that is over the counter. 2 sprays each nostril, once daily. Aim towards the same side eye when you spray.  There are available OTC, and the generic versions, which may be cheaper, are in parentheses. Show this to a pharmacist if you have trouble finding any of these items.  You might want to start using these medications early spring before pollen gets quite heavy.   Use an air humidifier at night. Put Neosporin or other triple antibiotic ointment up your nostril twice daily. You can use Vaseline in between these doses. Don't pick your nose.  Let us know if you need anything.

## 2020-02-28 DIAGNOSIS — N529 Male erectile dysfunction, unspecified: Secondary | ICD-10-CM

## 2020-02-28 MED ORDER — SILDENAFIL CITRATE 100 MG PO TABS: 50.0000 mg | ORAL_TABLET | Freq: Every day | ORAL | 3 refills | Status: DC | PRN

## 2020-03-17 MED ORDER — ATORVASTATIN CALCIUM 20 MG PO TABS
20.0000 mg | ORAL_TABLET | Freq: Every day | ORAL | 1 refills | Status: DC
Start: 2020-03-17 — End: 2020-03-30

## 2020-03-25 ENCOUNTER — Encounter: Payer: Self-pay | Admitting: Dermatology

## 2020-03-25 ENCOUNTER — Ambulatory Visit: Payer: Medicare Other | Admitting: Dermatology

## 2020-03-25 ENCOUNTER — Other Ambulatory Visit: Payer: Self-pay

## 2020-03-25 DIAGNOSIS — Z1283 Encounter for screening for malignant neoplasm of skin: Secondary | ICD-10-CM

## 2020-03-25 DIAGNOSIS — D0359 Melanoma in situ of other part of trunk: Secondary | ICD-10-CM

## 2020-03-25 DIAGNOSIS — Z86018 Personal history of other benign neoplasm: Secondary | ICD-10-CM

## 2020-03-25 DIAGNOSIS — D485 Neoplasm of uncertain behavior of skin: Secondary | ICD-10-CM

## 2020-03-25 NOTE — Patient Instructions (Signed)

## 2020-03-26 DIAGNOSIS — Z96652 Presence of left artificial knee joint: Secondary | ICD-10-CM | POA: Diagnosis not present

## 2020-03-26 DIAGNOSIS — M1711 Unilateral primary osteoarthritis, right knee: Secondary | ICD-10-CM | POA: Diagnosis not present

## 2020-03-29 ENCOUNTER — Encounter: Payer: Self-pay | Admitting: Dermatology

## 2020-03-30 MED ORDER — ATORVASTATIN CALCIUM 20 MG PO TABS
20.0000 mg | ORAL_TABLET | Freq: Every day | ORAL | 1 refills | Status: DC
Start: 2020-03-30 — End: 2020-09-07

## 2020-03-30 NOTE — Progress Notes (Signed)
   Follow-Up Visit   Subjective  [name] is a 70 y.o. male who presents for the following: Annual Exam (No new concerns).  Growth Location: Right upper back Duration: Perhaps 6 months Quality: Larger Associated Signs/Symptoms: Modifying Factors:  Severity:  Timing: Context:   Objective  Well appearing patient in no apparent distress; mood and affect are within normal limits. Objective  Right Abdomen (side) - Upper: No sign repigmentation  Objective  Right Upper Back: 1.2cm thick crust, SCCA versus I-S K.     Objective  Chest - Medial [facility]): General skin examination, no atypical moles or melanoma.   A full examination was performed including scalp, head, eyes, ears, nose, lips, neck, chest, axillae, abdomen, back, buttocks, bilateral upper extremities, bilateral lower extremities, hands, feet, fingers, toes, fingernails, and toenails. All findings within normal limits unless otherwise noted below.   Assessment & Plan    History of dysplastic nevus Right Abdomen (side) - Upper  Yearly skin check plus encouraged to examine his own skin twice yearly.  Continued UV protection  Neoplasm of uncertain behavior of skin Right Upper Back  Skin / nail biopsy Type of biopsy: tangential   Informed consent: discussed and consent obtained   Timeout: patient name, date of birth, surgical site, and procedure verified   Anesthesia: the lesion was anesthetized in a standard fashion   Anesthetic:  1% lidocaine w/ epinephrine 1-100,000 local infiltration Instrument used: flexible razor blade   Hemostasis achieved with: aluminum chloride and electrodesiccation   Outcome: patient tolerated procedure well   Post-procedure details: wound care instructions given    Specimen 1 - Surgical pathology Differential Diagnosis: MM  Check Margins: No  Screening exam for skin cancer Chest - Medial (Center)  Annual skin examination     I, Lavonna Monarch, MD, have  reviewed all documentation for this visit.  The documentation on 03/30/20 for the exam, diagnosis, procedures, and orders are all accurate and complete.

## 2020-03-31 ENCOUNTER — Telehealth: Payer: Self-pay | Admitting: Dermatology

## 2020-03-31 NOTE — Telephone Encounter (Signed)
Patient left message asking for his biopsy results.

## 2020-03-31 NOTE — Telephone Encounter (Signed)
No results back yet patient aware

## 2020-04-06 ENCOUNTER — Other Ambulatory Visit: Payer: Self-pay

## 2020-04-06 ENCOUNTER — Telehealth: Payer: Self-pay | Admitting: Dermatology

## 2020-04-06 ENCOUNTER — Telehealth: Payer: Self-pay | Admitting: Internal Medicine

## 2020-04-06 MED ORDER — DILTIAZEM HCL ER COATED BEADS 180 MG PO CP24
180.0000 mg | ORAL_CAPSULE | Freq: Every day | ORAL | 1 refills | Status: DC
Start: 2020-04-06 — End: 2020-09-22

## 2020-04-06 NOTE — Telephone Encounter (Signed)
*  STAT* If patient is at the pharmacy, call can be transferred to refill team.   1. Which medications need to be refilled? (please list name of each medication and dose if known) diltiazem (CARDIZEM CD) 180 MG 24 hr capsule  2. Which pharmacy/location (including street and city if local pharmacy) is medication to be sent to? [facility] [place], [place]  3. Do they need a 30 day or 90 day supply? [place]

## 2020-04-06 NOTE — Telephone Encounter (Signed)
Results, line 2

## 2020-04-07 NOTE — Telephone Encounter (Signed)
I reached Mr.Serda by phone.  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  My surgical schedule is too backed up, I did receive his okay to have this removed by Dr. Levada Dy or Dr. Link Snuffer if they are available sooner.  We will try him to call him from our office in the next 24 hours to get this arranged.

## 2020-04-09 NOTE — Telephone Encounter (Signed)
Phone call from patient stating that he thought someone was going to call him yesterday to schedule an appointment. I informed patient that we will be sending his information over to [facility] and they will contact him to schedule his appointment.  Patient understands and will wait for the call from [facility].

## 2020-04-09 NOTE — Telephone Encounter (Signed)
-----   Message from Lavonna Monarch, MD sent at 04/09/2020 11:26 AM EST ----- I already discussed biopsy results with Mr. Hofland by phone and will arrange for wide excision.

## 2020-04-23 DIAGNOSIS — D0361 Melanoma in situ of right upper limb, including shoulder: Secondary | ICD-10-CM | POA: Diagnosis not present

## 2020-04-23 DIAGNOSIS — L989 Disorder of the skin and subcutaneous tissue, unspecified: Secondary | ICD-10-CM | POA: Diagnosis not present

## 2020-06-12 ENCOUNTER — Other Ambulatory Visit: Payer: Self-pay | Admitting: Internal Medicine

## 2020-07-22 ENCOUNTER — Encounter: Payer: Self-pay | Admitting: Internal Medicine

## 2020-07-22 ENCOUNTER — Ambulatory Visit (INDEPENDENT_AMBULATORY_CARE_PROVIDER_SITE_OTHER): Payer: Medicare Other | Admitting: Internal Medicine

## 2020-07-22 ENCOUNTER — Other Ambulatory Visit: Payer: Self-pay

## 2020-07-22 VITALS — BP 138/80 | HR 75 | Ht 72.0 in | Wt 230.2 lb

## 2020-07-22 DIAGNOSIS — I471 Supraventricular tachycardia: Secondary | ICD-10-CM

## 2020-07-22 NOTE — Patient Instructions (Signed)
Medication Instructions:  Your physician recommends that you continue on your current medications as directed. Please refer to the Current Medication list given to you today.  *If you need a refill on your cardiac medications before your next appointment, please call your pharmacy*   Lab Work: None ordered.  If you have labs (blood work) drawn today and your tests are completely normal, you will receive your results only by: MyChart Message (if you have MyChart) OR A paper copy in the mail If you have any lab test that is abnormal or we need to change your treatment, we will call you to review the results.   Testing/Procedures: None ordered.    Follow-Up: At [facility], you and your health needs are our priority.  As part of our continuing mission to provide you with exceptional heart care, we have created designated Provider Care Teams.  These Care Teams include your primary Cardiologist (physician) and Advanced Practice Providers (APPs -  Physician Assistants and Nurse Practitioners) who all work together to provide you with the care you need, when you need it.  We recommend signing up for the patient portal called "MyChart".  Sign up information is provided on this After Visit Summary.  MyChart is used to connect with patients for Virtual Visits (Telemedicine).  Patients are able to view lab/test results, encounter notes, upcoming appointments, etc.  Non-urgent messages can be sent to your provider as well.   To learn more about what you can do with MyChart, go to [email or web address].    Your next appointment:   12 month(s)  The format for your next appointment:   In Person  Provider:   Dr Klein 
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
## 2020-09-06 ENCOUNTER — Other Ambulatory Visit: Payer: Self-pay | Admitting: Internal Medicine

## 2020-09-22 ENCOUNTER — Other Ambulatory Visit: Payer: Self-pay | Admitting: Internal Medicine

## 2021-02-12 DIAGNOSIS — H524 Presbyopia: Secondary | ICD-10-CM | POA: Diagnosis not present

## 2021-02-17 ENCOUNTER — Ambulatory Visit (INDEPENDENT_AMBULATORY_CARE_PROVIDER_SITE_OTHER): Payer: Medicare Other | Admitting: Family Medicine

## 2021-02-17 ENCOUNTER — Encounter: Payer: Self-pay | Admitting: Family Medicine

## 2021-02-17 VITALS — BP 122/70 | HR 70 | Temp 97.9°F | Ht 72.0 in | Wt 228.0 lb

## 2021-02-17 DIAGNOSIS — Z0000 Encounter for general adult medical examination without abnormal findings: Secondary | ICD-10-CM | POA: Diagnosis not present

## 2021-02-17 DIAGNOSIS — N529 Male erectile dysfunction, unspecified: Secondary | ICD-10-CM

## 2021-02-17 DIAGNOSIS — Z125 Encounter for screening for malignant neoplasm of prostate: Secondary | ICD-10-CM | POA: Diagnosis not present

## 2021-02-17 DIAGNOSIS — E782 Mixed hyperlipidemia: Secondary | ICD-10-CM

## 2021-02-17 LAB — COMPREHENSIVE METABOLIC PANEL
ALT: 26 U/L (ref 0–53)
AST: 23 U/L (ref 0–37)
Albumin: 4.4 g/dL (ref 3.5–5.2)
Alkaline Phosphatase: 71 U/L (ref 39–117)
BUN: 10 mg/dL (ref 6–23)
CO2: 30 mEq/L (ref 19–32)
Calcium: 9.2 mg/dL (ref 8.4–10.5)
Chloride: 103 mEq/L (ref 96–112)
Creatinine, Ser: 0.93 mg/dL (ref 0.40–1.50)
GFR: 83.17 mL/min (ref >60.00)
Glucose, Bld: 96 mg/dL (ref 70–99)
Potassium: 4.4 mEq/L (ref 3.5–5.1)
Sodium: 141 mEq/L (ref 135–145)
Total Bilirubin: 1.1 mg/dL (ref 0.2–1.2)
Total Protein: 6.8 g/dL (ref 6.0–8.3)

## 2021-02-17 LAB — LIPID PANEL
Cholesterol: 160 mg/dL (ref 0–200)
HDL: 58.5 mg/dL (ref >39.00)
LDL Cholesterol: 78 mg/dL (ref 0–99)
NonHDL: 101.34
Total CHOL/HDL Ratio: 3
Triglycerides: 115 mg/dL (ref 0.0–149.0)
VLDL: 23 mg/dL (ref 0.0–40.0)

## 2021-02-17 LAB — PSA, MEDICARE: PSA: 0.73 ng/ml (ref 0.10–4.00)

## 2021-02-17 MED ORDER — SILDENAFIL CITRATE 100 MG PO TABS: 50.0000 mg | ORAL_TABLET | Freq: Every day | ORAL | 3 refills | Status: DC | PRN
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
## 2021-02-17 NOTE — Patient Instructions (Addendum)
Give [place] 2-3 business days to get the results of your labs back.   Keep the diet clean and stay active.  I recommend getting the updated bivalent covid vaccination booster at your convenience.   Please get [place] a copy of your advanced directive at your convenience.   Let us know if you need anything.

## 2021-02-25 ENCOUNTER — Encounter: Payer: Self-pay | Admitting: Family Medicine

## 2021-02-25 DIAGNOSIS — N529 Male erectile dysfunction, unspecified: Secondary | ICD-10-CM

## 2021-02-25 MED ORDER — SILDENAFIL CITRATE 100 MG PO TABS: 50.0000 mg | ORAL_TABLET | Freq: Every day | ORAL | 3 refills | Status: DC | PRN

## 2021-03-01 ENCOUNTER — Other Ambulatory Visit: Payer: Self-pay | Admitting: Internal Medicine

## 2021-03-29 ENCOUNTER — Encounter: Payer: Self-pay | Admitting: Dermatology

## 2021-03-29 ENCOUNTER — Ambulatory Visit: Payer: Medicare Other | Admitting: Dermatology

## 2021-03-29 ENCOUNTER — Other Ambulatory Visit: Payer: Self-pay

## 2021-03-29 DIAGNOSIS — Z86018 Personal history of other benign neoplasm: Secondary | ICD-10-CM

## 2021-03-29 DIAGNOSIS — L821 Other seborrheic keratosis: Secondary | ICD-10-CM | POA: Diagnosis not present

## 2021-03-29 DIAGNOSIS — L219 Seborrheic dermatitis, unspecified: Secondary | ICD-10-CM

## 2021-03-29 DIAGNOSIS — Z1283 Encounter for screening for malignant neoplasm of skin: Secondary | ICD-10-CM

## 2021-03-29 DIAGNOSIS — L918 Other hypertrophic disorders of the skin: Secondary | ICD-10-CM | POA: Diagnosis not present

## 2021-03-29 NOTE — Patient Instructions (Addendum)
PICK UP OVER THE COUNTER HYDROCORTISONE OINT - APPLY TO THE BROWS 2 WEEKS AT A TIME  Unrelated to his skin problem I had the opportunity to discuss new weight loss medication with patient, specifically Wegovy and Mounjaro.  He has succeeded in significant weight loss with changes in his lifestyle but he is quite interested in discussing these medications with his primary care doctor should his progress stop.

## 2021-04-18 ENCOUNTER — Encounter: Payer: Self-pay | Admitting: Dermatology

## 2021-04-18 NOTE — Progress Notes (Signed)
° °  Follow-Up Visit   Subjective  [name] is a 71 y.o. male who presents for the following: Annual Exam (Pt here for skin exam. Pt denies any discomfort. Personal hx of atypia).  General skin examination Location:  Duration:  Quality:  Associated Signs/Symptoms: Modifying Factors:  Severity:  Timing: Context:   Objective  Well appearing patient in no apparent distress; mood and affect are within normal limits. Scalp Full body exam no atypical pigmented lesions or nonmelanoma skin cancer  Left Eyebrow, Right Eyebrow Mild erythema and scale  Left Thigh - Anterior Tan flattopped textured 5 mm papule, typical dermoscopy   Neck - Anterior 1 mm flesh-colored pedunculated papule    A full examination was performed including scalp, head, eyes, ears, nose, lips, neck, chest, axillae, abdomen, back, buttocks, bilateral upper extremities, bilateral lower extremities, hands, feet, fingers, toes, fingernails, and toenails. All findings within normal limits unless otherwise noted below.   Assessment & Plan    Screening exam for skin cancer Scalp  Annual exam   Discussed Wegovy   Seborrheic dermatitis Left Eyebrow; Right Eyebrow  Over-the-counter 1% hydrocortisone ointment applied nightly for 2 to 3 weeks and then on a as needed basis if doing well  Seborrheic keratosis Left Thigh - Anterior  Recheck as needed change  Skin tag Neck - Anterior  May choose future removal   Unrelated to his skin problem I had the opportunity to discuss new weight loss medication with patient, specifically Wegovy and Mounjaro.  He has succeeded in significant weight loss with changes in his lifestyle but he is quite interested in discussing these medications with his primary care doctor should his progress stop.   I, Brian Monarch, MD, have reviewed all documentation for this visit.  The documentation on 04/18/21 for the exam, diagnosis, procedures, and orders are all accurate  and complete.

## 2021-05-03 ENCOUNTER — Ambulatory Visit (INDEPENDENT_AMBULATORY_CARE_PROVIDER_SITE_OTHER): Payer: Medicare Other

## 2021-05-03 VITALS — Ht 72.0 in | Wt 228.0 lb

## 2021-05-03 DIAGNOSIS — Z0000 Encounter for general adult medical examination without abnormal findings: Secondary | ICD-10-CM

## 2021-05-03 NOTE — Patient Instructions (Signed)
Brian KitchenMr. [name] , Thank you for taking time to complete your Medicare Wellness Visit. I appreciate your ongoing commitment to your health goals. Please review the following plan we discussed and let me know if I can assist you in the future.   Screening recommendations/referrals: Colonoscopy: Per our conversation, you have an appt with GI in March. Recommended yearly ophthalmology/optometry visit for glaucoma screening and checkup Recommended yearly dental visit for hygiene and checkup  Vaccinations: Influenza vaccine: Up to date Pneumococcal vaccine: Up to date Tdap vaccine: Up to date Shingles vaccine: Completed vaccines   Covid-19: Up to date  Advanced directives: Copy in chart  Conditions/risks identified: See problem list  Next appointment: Follow up in one year for your annual wellness visit.   Preventive Care 71 Years and Older, Male Preventive care refers to lifestyle choices and visits with your health care provider that can promote health and wellness. What does preventive care include? A yearly physical exam. This is also called an annual well check. Dental exams once or twice a year. Routine eye exams. Ask your health care provider how often you should have your eyes checked. Personal lifestyle choices, including: Daily care of your teeth and gums. Regular physical activity. Eating a healthy diet. Avoiding tobacco and drug use. Limiting alcohol use. Practicing safe sex. Taking low doses of aspirin every day. Taking vitamin and mineral supplements as recommended by your health care provider. What happens during an annual well check? The services and screenings done by your health care provider during your annual well check will depend on your age, overall health, lifestyle risk factors, and family history of disease. Counseling  Your health care provider may ask you questions about your: Alcohol use. Tobacco use. Drug use. Emotional well-being. Home and  relationship well-being. Sexual activity. Eating habits. History of falls. Memory and ability to understand (cognition). Work and work Statistician. Screening  You may have the following tests or measurements: Height, weight, and BMI. Blood pressure. Lipid and cholesterol levels. These may be checked every 5 years, or more frequently if you are over 63 years old. Skin check. Lung cancer screening. You may have this screening every year starting at age 3 if you have a 30-pack-year history of smoking and currently smoke or have quit within the past 15 years. Fecal occult blood test (FOBT) of the stool. You may have this test every year starting at age 18. Flexible sigmoidoscopy or colonoscopy. You may have a sigmoidoscopy every 5 years or a colonoscopy every 10 years starting at age 31. Prostate cancer screening. Recommendations will vary depending on your family history and other risks. Hepatitis C blood test. Hepatitis B blood test. Sexually transmitted disease (STD) testing. Diabetes screening. This is done by checking your blood sugar (glucose) after you have not eaten for a while (fasting). You may have this done every 1-3 years. Abdominal aortic aneurysm (AAA) screening. You may need this if you are a current or former smoker. Osteoporosis. You may be screened starting at age 59 if you are at high risk. Talk with your health care provider about your test results, treatment options, and if necessary, the need for more tests. Vaccines  Your health care provider may recommend certain vaccines, such as: Influenza vaccine. This is recommended every year. Tetanus, diphtheria, and acellular pertussis (Tdap, Td) vaccine. You may need a Td booster every 10 years. Zoster vaccine. You may need this after age 60. Pneumococcal 13-valent conjugate (PCV13) vaccine. One dose is recommended after age 34. Pneumococcal  polysaccharide (PPSV23) vaccine. One dose is recommended after age 32. Talk to your  health care provider about which screenings and vaccines you need and how often you need them. This information is not intended to replace advice given to you by your health care provider. Make sure you discuss any questions you have with your health care provider. Document Released: 03/27/2015 Document Revised: 11/18/2015 Document Reviewed: 12/30/2014 Elsevier Interactive Patient Education  2017 [place] Prevention in the Home Falls can cause injuries. They can happen to people of all ages. There are many things you can do to make your home safe and to help prevent falls. What can I do on the outside of my home? Regularly fix the edges of walkways and driveways and fix any cracks. Remove anything that might make you trip as you walk through a door, such as a raised step or threshold. Trim any bushes or trees on the path to your home. Use bright outdoor lighting. Clear any walking paths of anything that might make someone trip, such as rocks or tools. Regularly check to see if handrails are loose or broken. Make sure that both sides of any steps have handrails. Any raised decks and porches should have guardrails on the edges. Have any leaves, snow, or ice cleared regularly. Use sand or salt on walking paths during winter. Clean up any spills in your garage right away. This includes oil or grease spills. What can I do in the bathroom? Use night lights. Install grab bars by the toilet and in the tub and shower. Do not use towel bars as grab bars. Use non-skid mats or decals in the tub or shower. If you need to sit down in the shower, use a plastic, non-slip stool. Keep the floor dry. Clean up any water that spills on the floor as soon as it happens. Remove soap buildup in the tub or shower regularly. Attach bath mats securely with double-sided non-slip rug tape. Do not have throw rugs and other things on the floor that can make you trip. What can I do in the bedroom? Use night  lights. Make sure that you have a light by your bed that is easy to reach. Do not use any sheets or blankets that are too big for your bed. They should not hang down onto the floor. Have a firm chair that has side arms. You can use this for support while you get dressed. Do not have throw rugs and other things on the floor that can make you trip. What can I do in the [name]? Clean up any spills right away. Avoid walking on wet floors. Keep items that you use a lot in easy-to-reach places. If you need to reach something above you, use a strong step stool that has a grab bar. Keep electrical cords out of the way. Do not use floor polish or wax that makes floors slippery. If you must use wax, use non-skid floor wax. Do not have throw rugs and other things on the floor that can make you trip. What can I do with my stairs? Do not leave any items on the stairs. Make sure that there are handrails on both sides of the stairs and use them. Fix handrails that are broken or loose. Make sure that handrails are as long as the stairways. Check any carpeting to make sure that it is firmly attached to the stairs. Fix any carpet that is loose or worn. Avoid having throw rugs at the top  or bottom of the stairs. If you do have throw rugs, attach them to the floor with carpet tape. Make sure that you have a light switch at the top of the stairs and the bottom of the stairs. If you do not have them, ask someone to add them for you. What else can I do to help prevent falls? Wear shoes that: Do not have high heels. Have rubber bottoms. Are comfortable and fit you well. Are closed at the toe. Do not wear sandals. If you use a stepladder: Make sure that it is fully opened. Do not climb a closed stepladder. Make sure that both sides of the stepladder are locked into place. Ask someone to hold it for you, if possible. Clearly mark and make sure that you can see: Any grab bars or handrails. First and last  steps. Where the edge of each step is. Use tools that help you move around (mobility aids) if they are needed. These include: Canes. Walkers. Scooters. Crutches. Turn on the lights when you go into a dark area. Replace any light bulbs as soon as they burn out. Set up your furniture so you have a clear path. Avoid moving your furniture around. If any of your floors are uneven, fix them. If there are any pets around you, be aware of where they are. Review your medicines with your doctor. Some medicines can make you feel dizzy. This can increase your chance of falling. Ask your doctor what other things that you can do to help prevent falls. This information is not intended to replace advice given to you by your health care provider. Make sure you discuss any questions you have with your health care provider. Document Released: 12/25/2008 Document Revised: 08/06/2015 Document Reviewed: 04/04/2014 Elsevier Interactive Patient Education  2017 [employer].
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
## 2021-05-25 ENCOUNTER — Encounter: Payer: Self-pay | Admitting: Family Medicine

## 2021-06-28 ENCOUNTER — Other Ambulatory Visit: Payer: Self-pay | Admitting: Internal Medicine

## 2021-07-27 ENCOUNTER — Ambulatory Visit: Payer: Medicare Other | Admitting: Internal Medicine

## 2021-08-18 ENCOUNTER — Ambulatory Visit: Payer: Medicare Other | Admitting: Internal Medicine

## 2021-08-18 ENCOUNTER — Encounter: Payer: Self-pay | Admitting: Internal Medicine

## 2021-08-18 VITALS — BP 130/76 | HR 72 | Ht 72.0 in | Wt 240.8 lb

## 2021-08-18 DIAGNOSIS — I471 Supraventricular tachycardia: Secondary | ICD-10-CM | POA: Diagnosis not present

## 2021-08-18 DIAGNOSIS — G4730 Sleep apnea, unspecified: Secondary | ICD-10-CM

## 2021-08-18 DIAGNOSIS — R0602 Shortness of breath: Secondary | ICD-10-CM

## 2021-08-18 LAB — CBC
Hematocrit: 40.6 % (ref 37.5–51.0)
Hemoglobin: 13.5 g/dL (ref 13.0–17.7)
MCH: 30.3 pg (ref 26.6–33.0)
MCHC: 33.3 g/dL (ref 31.5–35.7)
MCV: 91 fL (ref 79–97)
Platelets: 294 10*3/uL (ref 150–450)
RBC: 4.45 x10E6/uL (ref 4.14–5.80)
RDW: 13.8 % (ref 11.6–15.4)
WBC: 6.1 10*3/uL (ref 3.4–10.8)

## 2021-08-18 LAB — D-DIMER, QUANTITATIVE: D-DIMER: 0.34 mg/L FEU (ref 0.00–0.49)

## 2021-08-18 MED ORDER — FUROSEMIDE 40 MG PO TABS: ORAL_TABLET | ORAL | 0 refills | Status: DC

## 2021-08-18 NOTE — Patient Instructions (Signed)
Medication Instructions:  Your physician has recommended you make the following change in your medication:   ** Begin Furosemide '40mg'$  - 1 tablet by mouth every other day x 5 doses.  *If you need a refill on your cardiac medications before your next appointment, please call your pharmacy*   Lab Work:  CBC today  If you have labs (blood work) drawn today and your tests are completely normal, you will receive your results only by: [place] (if you have MyChart) OR A paper copy in the mail If you have any lab test that is abnormal or we need to change your treatment, we will call you to review the results.   Testing/Procedures: Your physician has requested that you have an echocardiogram. Echocardiography is a painless test that uses sound waves to create images of your heart. It provides your doctor with information about the size and shape of your heart and how well your heart's chambers and valves are working. This procedure takes approximately one hour. There are no restrictions for this procedure.  Your physician has recommended that you have a sleep study. This test records several body functions during sleep, including: brain activity, eye movement, oxygen and carbon dioxide blood levels, heart rate and rhythm, breathing rate and rhythm, the flow of air through your mouth and nose, snoring, body muscle movements, and chest and belly movement.     Follow-Up: At [facility], you and your health needs are our priority.  As part of our continuing mission to provide you with exceptional heart care, we have created designated Provider Care Teams.  These Care Teams include your primary Cardiologist (physician) and Advanced Practice Providers (APPs -  Physician Assistants and Nurse Practitioners) who all work together to provide you with the care you need, when you need it.  We recommend signing up for the patient portal called "MyChart".  Sign up information is provided on this  After Visit Summary.  MyChart is used to connect with patients for Virtual Visits (Telemedicine).  Patients are able to view lab/test results, encounter notes, upcoming appointments, etc.  Non-urgent messages can be sent to your provider as well.   To learn more about what you can do with MyChart, go to [email or web address].    Your next appointment:   6 week(s)  The format for your next appointment:   In Person  Provider:   You will see one of the following Advanced Practice Providers on your designated Care Team:   Tommye Standard, [place] Legrand Como "Jonni Sanger" Chalmers Cater, [place]  Then, Dr Caryl Comes will plan to see you again in 6 month(s).{   Important Information About Sugar
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
## 2021-08-24 ENCOUNTER — Telehealth: Payer: Self-pay | Admitting: *Deleted

## 2021-08-24 NOTE — Telephone Encounter (Signed)
Prior Authorization for HST sent to [facility] via web portal. Tracking Number .  do not require Pre-Authorization by [employer].

## 2021-09-01 DIAGNOSIS — D123 Benign neoplasm of transverse colon: Secondary | ICD-10-CM | POA: Diagnosis not present

## 2021-09-01 DIAGNOSIS — D122 Benign neoplasm of ascending colon: Secondary | ICD-10-CM | POA: Diagnosis not present

## 2021-09-01 DIAGNOSIS — Z86010 Personal history of colonic polyps: Secondary | ICD-10-CM | POA: Diagnosis not present

## 2021-09-01 DIAGNOSIS — K644 Residual hemorrhoidal skin tags: Secondary | ICD-10-CM | POA: Diagnosis not present

## 2021-09-01 DIAGNOSIS — K648 Other hemorrhoids: Secondary | ICD-10-CM | POA: Diagnosis not present

## 2021-09-01 DIAGNOSIS — Z09 Encounter for follow-up examination after completed treatment for conditions other than malignant neoplasm: Secondary | ICD-10-CM | POA: Diagnosis not present

## 2021-09-01 DIAGNOSIS — K5730 Diverticulosis of large intestine without perforation or abscess without bleeding: Secondary | ICD-10-CM | POA: Diagnosis not present

## 2021-09-01 LAB — HM COLONOSCOPY

## 2021-09-03 ENCOUNTER — Ambulatory Visit (HOSPITAL_COMMUNITY): Payer: Medicare Other | Attending: Cardiology

## 2021-09-03 DIAGNOSIS — D123 Benign neoplasm of transverse colon: Secondary | ICD-10-CM | POA: Diagnosis not present

## 2021-09-03 DIAGNOSIS — R0602 Shortness of breath: Secondary | ICD-10-CM | POA: Insufficient documentation

## 2021-09-03 DIAGNOSIS — I471 Supraventricular tachycardia: Secondary | ICD-10-CM | POA: Diagnosis not present

## 2021-09-03 DIAGNOSIS — D122 Benign neoplasm of ascending colon: Secondary | ICD-10-CM | POA: Diagnosis not present

## 2021-09-03 LAB — ECHOCARDIOGRAM COMPLETE
Area-P 1/2: 3.39 cm2
P 1/2 time: 440 msec
S' Lateral: 3 cm

## 2021-09-05 ENCOUNTER — Other Ambulatory Visit: Payer: Self-pay | Admitting: Internal Medicine

## 2021-09-06 ENCOUNTER — Other Ambulatory Visit: Payer: Self-pay | Admitting: Internal Medicine

## 2021-09-17 ENCOUNTER — Encounter: Payer: Self-pay | Admitting: Family Medicine
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
## 2021-10-06 ENCOUNTER — Ambulatory Visit: Payer: Medicare Other | Admitting: Physician Assistant

## 2021-10-06 ENCOUNTER — Encounter: Payer: Self-pay | Admitting: Physician Assistant

## 2021-10-06 VITALS — BP 140/60 | HR 79 | Ht 75.0 in | Wt 236.2 lb

## 2021-10-06 DIAGNOSIS — R0602 Shortness of breath: Secondary | ICD-10-CM | POA: Diagnosis not present

## 2021-10-06 DIAGNOSIS — I77810 Thoracic aortic ectasia: Secondary | ICD-10-CM

## 2021-10-06 DIAGNOSIS — I471 Supraventricular tachycardia: Secondary | ICD-10-CM | POA: Diagnosis not present

## 2021-10-06 DIAGNOSIS — I10 Essential (primary) hypertension: Secondary | ICD-10-CM | POA: Diagnosis not present

## 2021-10-06 MED ORDER — METOPROLOL TARTRATE 100 MG PO TABS: 100.0000 mg | ORAL_TABLET | Freq: Once | ORAL | 0 refills | Status: DC

## 2021-10-06 NOTE — Patient Instructions (Addendum)
Medication Instructions:  Your physician recommends that you continue on your current medications as directed. Please refer to the Current Medication list given to you today.  TAKE ONCE:  LOPRESSOR 100 Mg ( ONE TABLET taken by mouth once. ) Take this 2 hours prior to your Coronary CT and Chest CT scan. ( Per CT instructions below. )  Labwork: You will have a BMET drawn today  Testing/Procedures:  Please Schedule a Coronary CTA w/FFR and Chest CTA   Follow-Up: Your physician wants you to follow up with Dr. Olin Pia, MD appointment already scheduled.     Any Other Special Instructions Will Be Listed Below (If Applicable).  If you need a refill on your cardiac medications before your next appointment, please call your pharmacy.   Important Information About Sugar        Your cardiac CT will be scheduled at one of the below locations:   [facility] [address] [place],  [address] [phone]    If scheduled at [facility], please arrive at the [facility] Entrance (Entrance C2) of [facility] 30 minutes prior to test start time. You can use the FREE valet parking offered at entrance C (encouraged to control the heart rate for the test)  Proceed to the [facility] Radiology Department (first floor) to check-in and test prep.  All radiology patients and guests should use entrance C2 at [facility], accessed from [facility], even though the hospital's physical address listed is [address]    If scheduled at [facility], please arrive 15 mins early for check-in and test prep.  Please follow these instructions carefully (unless otherwise directed):  Hold all erectile dysfunction medications at least 3 days (72 hrs) prior to test.  On the Night Before the Test: Be sure to Drink plenty of water. Do not consume any caffeinated/decaffeinated beverages or chocolate 12  hours prior to your test. Do not take any antihistamines 12 hours prior to your test.  On the Day of the Test: Drink plenty of water until 1 hour prior to the test. Do not eat any food 4 hours prior to the test. You may take your regular medications prior to the test.  Take metoprolol (Lopressor) two hours prior to test. HOLD Furosemide/Hydrochlorothiazide morning of the test. FEMALES- please wear underwire-free bra if available, avoid dresses & tight clothing   *For Clinical Staff only. Please instruct patient the following:* Heart Rate Medication Recommendations for Cardiac CT  Resting HR < 50 bpm  No medication  Resting HR 50-60 bpm and BP >110/50 mmHG   Consider Metoprolol tartrate 25 mg PO 90-120 min prior to scan  Resting HR 60-65 bpm and BP >110/50 mmHG  Metoprolol tartrate 50 mg PO 90-120 minutes prior to scan   Resting HR > 65 bpm and BP >110/50 mmHG  Metoprolol tartrate 100 mg PO 90-120 minutes prior to scan  Consider Ivabradine 10-15 mg PO or a calcium channel blocker for resting HR >60 bpm and contraindication to metoprolol tartrate  Consider Ivabradine 10-15 mg PO in combination with metoprolol tartrate for HR >80 bpm         After the Test: Drink plenty of water. After receiving IV contrast, you may experience a mild flushed feeling. This is normal. On occasion, you may experience a mild rash up to 24 hours after the test. This is not dangerous. If this occurs, you can take Benadryl 25 mg  and increase your fluid intake. If you experience trouble breathing, this can be serious. If it is severe call 911 IMMEDIATELY. If it is mild, please call our office. If you take any of these medications: Glipizide/Metformin, Avandament, Glucavance, please do not take 48 hours after completing test unless otherwise instructed.  We will call to schedule your test 2-4 weeks out understanding that some insurance companies will need an authorization prior to the service being performed.    For non-scheduling related questions, please contact the cardiac imaging nurse navigator should you have any questions/concerns: Marchia Bond, Cardiac Imaging Nurse Navigator Gordy Clement, Cardiac Imaging Nurse Navigator [facility] Direct Office Dial: [phone]   For scheduling needs, including cancellations and rescheduling, please call [place], [phone].

## 2021-10-07 LAB — BASIC METABOLIC PANEL
BUN/Creatinine Ratio: 10 (ref 10–24)
BUN: 9 mg/dL (ref 8–27)
CO2: 23 mmol/L (ref 20–29)
Calcium: 9.2 mg/dL (ref 8.6–10.2)
Chloride: 101 mmol/L (ref 96–106)
Creatinine, Ser: 0.88 mg/dL (ref 0.76–1.27)
Glucose: 94 mg/dL (ref 70–99)
Potassium: 4.5 mmol/L (ref 3.5–5.2)
Sodium: 143 mmol/L (ref 134–144)
eGFR: 92 mL/min/{1.73_m2} (ref >59)

## 2021-10-13 ENCOUNTER — Encounter (HOSPITAL_BASED_OUTPATIENT_CLINIC_OR_DEPARTMENT_OTHER): Payer: Medicare Other | Admitting: Internal Medicine

## 2021-10-26 ENCOUNTER — Telehealth: Payer: Self-pay | Admitting: Physician Assistant

## 2021-10-26 NOTE — Telephone Encounter (Signed)
[employer] called about CT Scan. the Auth was approved for the CT Scan with contrast, she stated someone from the office needs to call and withdrawal the original request for the reschedule date of 8/29 and start a new request needs to be done through the care line dept.  medical benefits [phone].  The part that was denied without contrast someone needs to contact same number and needs to schedule a peer to peer.

## 2021-10-27 ENCOUNTER — Ambulatory Visit (HOSPITAL_COMMUNITY): Admission: RE | Admit: 2021-10-27 | Payer: Medicare Other | Source: Ambulatory Visit

## 2021-11-08 ENCOUNTER — Telehealth (HOSPITAL_COMMUNITY): Payer: Self-pay | Admitting: *Deleted

## 2021-11-08 NOTE — Telephone Encounter (Signed)
Reaching out to patient to offer assistance regarding upcoming cardiac imaging study; pt verbalizes understanding of appt date/time, parking situation and where to check in, pre-test NPO status and medications ordered, and verified current allergies; name and call back number provided for further questions should they arise  Nashay Brickley RN Navigator Cardiac Imaging [facility] [phone] office [phone] cell  Patient to take 100mg metoprolol tartrate two hours prior to his cardiac CT scan. He is aware to arrive at 10:30am. 

## 2021-11-09 ENCOUNTER — Ambulatory Visit (HOSPITAL_COMMUNITY)
Admission: RE | Admit: 2021-11-09 | Discharge: 2021-11-09 | Disposition: A | Discharge status: Home / Self Care | Payer: Medicare Other | Source: Ambulatory Visit | Attending: Physician Assistant | Admitting: Physician Assistant

## 2021-11-09 DIAGNOSIS — R911 Solitary pulmonary nodule: Secondary | ICD-10-CM | POA: Diagnosis not present

## 2021-11-09 DIAGNOSIS — I2510 Atherosclerotic heart disease of native coronary artery without angina pectoris: Secondary | ICD-10-CM | POA: Insufficient documentation

## 2021-11-09 DIAGNOSIS — I10 Essential (primary) hypertension: Secondary | ICD-10-CM | POA: Diagnosis not present

## 2021-11-09 DIAGNOSIS — I700 Atherosclerosis of aorta: Secondary | ICD-10-CM | POA: Diagnosis not present

## 2021-11-09 DIAGNOSIS — I471 Supraventricular tachycardia: Secondary | ICD-10-CM | POA: Diagnosis not present

## 2021-11-09 MED ORDER — NITROGLYCERIN 0.4 MG SL SUBL
SUBLINGUAL_TABLET | SUBLINGUAL | Status: AC
  Filled 2021-11-09: qty 2

## 2021-11-09 MED ORDER — IOHEXOL 350 MG/ML SOLN
100.0000 mL | Freq: Once | INTRAVENOUS | Status: AC | PRN
  Administered 2021-11-09: 100 mL via INTRAVENOUS

## 2021-11-09 MED ORDER — NITROGLYCERIN 0.4 MG SL SUBL
0.8000 mg | SUBLINGUAL_TABLET | Freq: Once | SUBLINGUAL | Status: AC
  Administered 2021-11-09: 0.8 mg via SUBLINGUAL

## 2021-12-02 ENCOUNTER — Other Ambulatory Visit: Payer: Self-pay | Admitting: Internal Medicine

## 2022-02-15 DIAGNOSIS — I471 Supraventricular tachycardia, unspecified: Secondary | ICD-10-CM | POA: Insufficient documentation

## 2022-02-16 ENCOUNTER — Encounter: Payer: Medicare Other | Admitting: Family Medicine

## 2022-02-17 ENCOUNTER — Ambulatory Visit: Payer: Medicare Other | Attending: Internal Medicine | Admitting: Internal Medicine

## 2022-02-17 ENCOUNTER — Encounter: Payer: Self-pay | Admitting: Internal Medicine

## 2022-02-17 VITALS — BP 140/86 | HR 89 | Ht 75.0 in | Wt 232.0 lb

## 2022-02-17 DIAGNOSIS — I471 Supraventricular tachycardia, unspecified: Secondary | ICD-10-CM | POA: Diagnosis not present

## 2022-02-17 MED ORDER — LOSARTAN POTASSIUM-HCTZ 50-12.5 MG PO TABS: 1.0000 | ORAL_TABLET | Freq: Every day | ORAL | 3 refills | Status: DC
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
## 2022-02-17 NOTE — Patient Instructions (Signed)
Medication Instructions:  Your physician has recommended you make the following change in your medication:   ** Stop Losartan '50mg'$   ** Start Losartan/HCTZ 50-12.'5mg'$  - 1 tablet by mouth daily  *If you need a refill on your cardiac medications before your next appointment, please call your pharmacy*   Lab Work: None ordered.  If you have labs (blood work) drawn today and your tests are completely normal, you will receive your results only by: [place] (if you have MyChart) OR A paper copy in the mail If you have any lab test that is abnormal or we need to change your treatment, we will call you to review the results.   Testing/Procedures: None ordered.    Follow-Up: At [facility], you and your health needs are our priority.  As part of our continuing mission to provide you with exceptional heart care, we have created designated Provider Care Teams.  These Care Teams include your primary Cardiologist (physician) and Advanced Practice Providers (APPs -  Physician Assistants and Nurse Practitioners) who all work together to provide you with the care you need, when you need it.  We recommend signing up for the patient portal called "MyChart".  Sign up information is provided on this After Visit Summary.  MyChart is used to connect with patients for Virtual Visits (Telemedicine).  Patients are able to view lab/test results, encounter notes, upcoming appointments, etc.  Non-urgent messages can be sent to your provider as well.   To learn more about what you can do with MyChart, go to [email or web address].    Your next appointment:   6 months with Dr Caryl Comes  Important Information About Sugar

## 2022-02-28 ENCOUNTER — Other Ambulatory Visit: Payer: Self-pay | Admitting: Internal Medicine

## 2022-03-08 ENCOUNTER — Encounter: Payer: Self-pay | Admitting: Family Medicine

## 2022-03-08 ENCOUNTER — Ambulatory Visit (INDEPENDENT_AMBULATORY_CARE_PROVIDER_SITE_OTHER): Payer: Medicare Other | Admitting: Family Medicine

## 2022-03-08 VITALS — BP 134/68 | HR 60 | Temp 98.4°F | Resp 18 | Ht 75.0 in | Wt 235.2 lb

## 2022-03-08 DIAGNOSIS — I700 Atherosclerosis of aorta: Secondary | ICD-10-CM | POA: Insufficient documentation

## 2022-03-08 DIAGNOSIS — Z125 Encounter for screening for malignant neoplasm of prostate: Secondary | ICD-10-CM

## 2022-03-08 DIAGNOSIS — N529 Male erectile dysfunction, unspecified: Secondary | ICD-10-CM

## 2022-03-08 DIAGNOSIS — Z0000 Encounter for general adult medical examination without abnormal findings: Secondary | ICD-10-CM | POA: Diagnosis not present

## 2022-03-08 LAB — COMPREHENSIVE METABOLIC PANEL
ALT: 35 U/L (ref 0–53)
AST: 24 U/L (ref 0–37)
Albumin: 4.4 g/dL (ref 3.5–5.2)
Alkaline Phosphatase: 67 U/L (ref 39–117)
BUN: 11 mg/dL (ref 6–23)
CO2: 30 mEq/L (ref 19–32)
Calcium: 9.3 mg/dL (ref 8.4–10.5)
Chloride: 100 mEq/L (ref 96–112)
Creatinine, Ser: 0.88 mg/dL (ref 0.40–1.50)
GFR: 86.46 mL/min (ref >60.00)
Glucose, Bld: 103 mg/dL — ABNORMAL HIGH (ref 70–99)
Potassium: 4.3 mEq/L (ref 3.5–5.1)
Sodium: 140 mEq/L (ref 135–145)
Total Bilirubin: 0.9 mg/dL (ref 0.2–1.2)
Total Protein: 7 g/dL (ref 6.0–8.3)

## 2022-03-08 LAB — LIPID PANEL
Cholesterol: 169 mg/dL (ref 0–200)
HDL: 60.2 mg/dL (ref >39.00)
LDL Cholesterol: 84 mg/dL (ref 0–99)
NonHDL: 108.58
Total CHOL/HDL Ratio: 3
Triglycerides: 125 mg/dL (ref 0.0–149.0)
VLDL: 25 mg/dL (ref 0.0–40.0)

## 2022-03-08 LAB — CBC
HCT: 40.7 % (ref 39.0–52.0)
Hemoglobin: 13.6 g/dL (ref 13.0–17.0)
MCHC: 33.3 g/dL (ref 30.0–36.0)
MCV: 93 fl (ref 78.0–100.0)
Platelets: 291 10*3/uL (ref 150.0–400.0)
RBC: 4.38 Mil/uL (ref 4.22–5.81)
RDW: 12.9 % (ref 11.5–15.5)
WBC: 6.5 10*3/uL (ref 4.0–10.5)

## 2022-03-08 LAB — PSA, MEDICARE: PSA: 1.02 ng/ml (ref 0.10–4.00)

## 2022-03-08 MED ORDER — SILDENAFIL CITRATE 100 MG PO TABS: 50.0000 mg | ORAL_TABLET | Freq: Every day | ORAL | 3 refills | Status: DC | PRN
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
## 2022-03-08 NOTE — Patient Instructions (Signed)
Give [place] 2-3 business days to get the results of your labs back.   Keep the diet clean and stay active.  Consider going to the pharmacy to get a tetanus booster.   Let us know if you need anything.

## 2022-03-10 ENCOUNTER — Encounter: Payer: Self-pay | Admitting: Family Medicine

## 2022-03-11 ENCOUNTER — Encounter: Payer: Self-pay | Admitting: Family Medicine

## 2022-03-23 DIAGNOSIS — D485 Neoplasm of uncertain behavior of skin: Secondary | ICD-10-CM | POA: Diagnosis not present

## 2022-03-23 DIAGNOSIS — L400 Psoriasis vulgaris: Secondary | ICD-10-CM | POA: Diagnosis not present

## 2022-03-23 DIAGNOSIS — D225 Melanocytic nevi of trunk: Secondary | ICD-10-CM | POA: Diagnosis not present

## 2022-03-23 DIAGNOSIS — L570 Actinic keratosis: Secondary | ICD-10-CM | POA: Diagnosis not present

## 2022-03-23 DIAGNOSIS — C44319 Basal cell carcinoma of skin of other parts of face: Secondary | ICD-10-CM | POA: Diagnosis not present

## 2022-03-23 DIAGNOSIS — Z85820 Personal history of malignant melanoma of skin: Secondary | ICD-10-CM | POA: Diagnosis not present

## 2022-03-23 DIAGNOSIS — L814 Other melanin hyperpigmentation: Secondary | ICD-10-CM | POA: Diagnosis not present

## 2022-04-08 DIAGNOSIS — K080 Exfoliation of teeth due to systemic causes: Secondary | ICD-10-CM | POA: Diagnosis not present

## 2022-04-18 ENCOUNTER — Encounter: Payer: Self-pay | Admitting: Internal Medicine

## 2022-04-20 MED ORDER — DILTIAZEM HCL ER COATED BEADS 180 MG PO CP24: 180.0000 mg | ORAL_CAPSULE | Freq: Every day | ORAL | 0 refills | Status: DC

## 2022-04-20 MED ORDER — DILTIAZEM HCL ER COATED BEADS 180 MG PO CP24: 180.0000 mg | ORAL_CAPSULE | Freq: Every day | ORAL | 3 refills | Status: DC

## 2022-05-02 ENCOUNTER — Other Ambulatory Visit: Payer: Self-pay | Admitting: Internal Medicine

## 2022-05-03 DIAGNOSIS — C44319 Basal cell carcinoma of skin of other parts of face: Secondary | ICD-10-CM | POA: Diagnosis not present

## 2022-05-04 ENCOUNTER — Ambulatory Visit (INDEPENDENT_AMBULATORY_CARE_PROVIDER_SITE_OTHER): Payer: Medicare Other | Admitting: *Deleted

## 2022-05-04 DIAGNOSIS — Z0000 Encounter for general adult medical examination without abnormal findings: Secondary | ICD-10-CM

## 2022-05-04 NOTE — Patient Instructions (Signed)
[name] , Thank you for taking time to come for your Medicare Wellness Visit. I appreciate your ongoing commitment to your health goals. Please review the following plan we discussed and let me know if I can assist you in the future.   These are the goals we discussed:  Goals      Patient Stated     Maintain current health        This is a list of the screening recommended for you and due dates:  Health Maintenance  Topic Date Due   Medicare Annual Wellness Visit  05/05/2023   Colon Cancer Screening  09/01/2024   DTaP/Tdap/Td vaccine (3 - Td or Tdap) 02/09/2032   Pneumonia Vaccine  Completed   Flu Shot  Completed   Hepatitis C Screening: USPSTF Recommendation to screen - Ages 18-79 yo.  Completed   Zoster (Shingles) Vaccine  Completed   HPV Vaccine  Aged Out   COVID-19 Vaccine  Discontinued     Next appointment: Follow up in one year for your annual wellness visit.   Preventive Care 82 Years and Older, Male Preventive care refers to lifestyle choices and visits with your health care provider that can promote health and wellness. What does preventive care include? A yearly physical exam. This is also called an annual well check. Dental exams once or twice a year. Routine eye exams. Ask your health care provider how often you should have your eyes checked. Personal lifestyle choices, including: Daily care of your teeth and gums. Regular physical activity. Eating a healthy diet. Avoiding tobacco and drug use. Limiting alcohol use. Practicing safe sex. Taking low doses of aspirin every day. Taking vitamin and mineral supplements as recommended by your health care provider. What happens during an annual well check? The services and screenings done by your health care provider during your annual well check will depend on your age, overall health, lifestyle risk factors, and family history of disease. Counseling  Your health care provider may ask you questions about  your: Alcohol use. Tobacco use. Drug use. Emotional well-being. Home and relationship well-being. Sexual activity. Eating habits. History of falls. Memory and ability to understand (cognition). Work and work Statistician. Screening  You may have the following tests or measurements: Height, weight, and BMI. Blood pressure. Lipid and cholesterol levels. These may be checked every 5 years, or more frequently if you are over 88 years old. Skin check. Lung cancer screening. You may have this screening every year starting at age 71 if you have a 30-pack-year history of smoking and currently smoke or have quit within the past 15 years. Fecal occult blood test (FOBT) of the stool. You may have this test every year starting at age 38. Flexible sigmoidoscopy or colonoscopy. You may have a sigmoidoscopy every 5 years or a colonoscopy every 10 years starting at age 74. Prostate cancer screening. Recommendations will vary depending on your family history and other risks. Hepatitis C blood test. Hepatitis B blood test. Sexually transmitted disease (STD) testing. Diabetes screening. This is done by checking your blood sugar (glucose) after you have not eaten for a while (fasting). You may have this done every 1-3 years. Abdominal aortic aneurysm (AAA) screening. You may need this if you are a current or former smoker. Osteoporosis. You may be screened starting at age 61 if you are at high risk. Talk with your health care provider about your test results, treatment options, and if necessary, the need for more tests. Vaccines  Your  health care provider may recommend certain vaccines, such as: Influenza vaccine. This is recommended every year. Tetanus, diphtheria, and acellular pertussis (Tdap, Td) vaccine. You may need a Td booster every 10 years. Zoster vaccine. You may need this after age 72. Pneumococcal 13-valent conjugate (PCV13) vaccine. One dose is recommended after age 53. Pneumococcal  polysaccharide (PPSV23) vaccine. One dose is recommended after age 4. Talk to your health care provider about which screenings and vaccines you need and how often you need them. This information is not intended to replace advice given to you by your health care provider. Make sure you discuss any questions you have with your health care provider. Document Released: 03/27/2015 Document Revised: 11/18/2015 Document Reviewed: 12/30/2014 Elsevier Interactive Patient Education  2017 [employer] Prevention in the Home Falls can cause injuries. They can happen to people of all ages. There are many things you can do to make your home safe and to help prevent falls. What can I do on the outside of my home? Regularly fix the edges of walkways and driveways and fix any cracks. Remove anything that might make you trip as you walk through a door, such as a raised step or threshold. Trim any bushes or trees on the path to your home. Use bright outdoor lighting. Clear any walking paths of anything that might make someone trip, such as rocks or tools. Regularly check to see if handrails are loose or broken. Make sure that both sides of any steps have handrails. Any raised decks and porches should have guardrails on the edges. Have any leaves, snow, or ice cleared regularly. Use sand or salt on walking paths during winter. Clean up any spills in your garage right away. This includes oil or grease spills. What can I do in the bathroom? Use night lights. Install grab bars by the toilet and in the tub and shower. Do not use towel bars as grab bars. Use non-skid mats or decals in the tub or shower. If you need to sit down in the shower, use a plastic, non-slip stool. Keep the floor dry. Clean up any water that spills on the floor as soon as it happens. Remove soap buildup in the tub or shower regularly. Attach bath mats securely with double-sided non-slip rug tape. Do not have throw rugs and other  things on the floor that can make you trip. What can I do in the bedroom? Use night lights. Make sure that you have a light by your bed that is easy to reach. Do not use any sheets or blankets that are too big for your bed. They should not hang down onto the floor. Have a firm chair that has side arms. You can use this for support while you get dressed. Do not have throw rugs and other things on the floor that can make you trip. What can I do in the kitchen? Clean up any spills right away. Avoid walking on wet floors. Keep items that you use a lot in easy-to-reach places. If you need to reach something above you, use a strong step stool that has a grab bar. Keep electrical cords out of the way. Do not use floor polish or wax that makes floors slippery. If you must use wax, use non-skid floor wax. Do not have throw rugs and other things on the floor that can make you trip. What can I do with my stairs? Do not leave any items on the stairs. Make sure that there are handrails  on both sides of the stairs and use them. Fix handrails that are broken or loose. Make sure that handrails are as long as the stairways. Check any carpeting to make sure that it is firmly attached to the stairs. Fix any carpet that is loose or worn. Avoid having throw rugs at the top or bottom of the stairs. If you do have throw rugs, attach them to the floor with carpet tape. Make sure that you have a light switch at the top of the stairs and the bottom of the stairs. If you do not have them, ask someone to add them for you. What else can I do to help prevent falls? Wear shoes that: Do not have high heels. Have rubber bottoms. Are comfortable and fit you well. Are closed at the toe. Do not wear sandals. If you use a stepladder: Make sure that it is fully opened. Do not climb a closed stepladder. Make sure that both sides of the stepladder are locked into place. Ask someone to hold it for you, if possible. Clearly  mark and make sure that you can see: Any grab bars or handrails. First and last steps. Where the edge of each step is. Use tools that help you move around (mobility aids) if they are needed. These include: Canes. Walkers. Scooters. Crutches. Turn on the lights when you go into a dark area. Replace any light bulbs as soon as they burn out. Set up your furniture so you have a clear path. Avoid moving your furniture around. If any of your floors are uneven, fix them. If there are any pets around you, be aware of where they are. Review your medicines with your doctor. Some medicines can make you feel dizzy. This can increase your chance of falling. Ask your doctor what other things that you can do to help prevent falls. This information is not intended to replace advice given to you by your health care provider. Make sure you discuss any questions you have with your health care provider. Document Released: 12/25/2008 Document Revised: 08/06/2015 Document Reviewed: 04/04/2014 Elsevier Interactive Patient Education  2017 [employer].
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
## 2022-06-09 DIAGNOSIS — M1711 Unilateral primary osteoarthritis, right knee: Secondary | ICD-10-CM | POA: Diagnosis not present

## 2022-06-16 DIAGNOSIS — M1711 Unilateral primary osteoarthritis, right knee: Secondary | ICD-10-CM | POA: Diagnosis not present

## 2022-06-23 DIAGNOSIS — M1711 Unilateral primary osteoarthritis, right knee: Secondary | ICD-10-CM | POA: Diagnosis not present

## 2022-07-03 ENCOUNTER — Encounter: Payer: Self-pay | Admitting: Family Medicine

## 2022-07-04 MED ORDER — FLUTICASONE PROPIONATE 50 MCG/ACT NA SUSP: 2.0000 | Freq: Every day | NASAL | 0 refills | Status: DC

## 2022-08-29 ENCOUNTER — Other Ambulatory Visit: Payer: Self-pay | Admitting: Internal Medicine

## 2022-08-31 ENCOUNTER — Ambulatory Visit: Payer: Medicare Other | Attending: Internal Medicine | Admitting: Internal Medicine

## 2022-08-31 ENCOUNTER — Encounter: Payer: Self-pay | Admitting: Internal Medicine

## 2022-08-31 VITALS — BP 126/62 | HR 70 | Ht 75.0 in | Wt 236.0 lb

## 2022-08-31 DIAGNOSIS — I471 Supraventricular tachycardia, unspecified: Secondary | ICD-10-CM

## 2022-08-31 NOTE — Patient Instructions (Signed)
Medication Instructions:  Your physician recommends that you continue on your current medications as directed. Please refer to the Current Medication list given to you today.  *If you need a refill on your cardiac medications before your next appointment, please call your pharmacy*   Lab Work: None ordered If you have labs (blood work) drawn today and your tests are completely normal, you will receive your results only by: MyChart Message (if you have MyChart) OR A paper copy in the mail If you have any lab test that is abnormal or we need to change your treatment, we will call you to review the results.   Testing/Procedures: None ordered   Follow-Up: At [facility], you and your health needs are our priority.  As part of our continuing mission to provide you with exceptional heart care, we have created designated Provider Care Teams.  These Care Teams include your primary Cardiologist (physician) and Advanced Practice Providers (APPs -  Physician Assistants and Nurse Practitioners) who all work together to provide you with the care you need, when you need it.  Your next appointment:   1 year(s)  The format for your next appointment:   In Person  Provider:   Sherryl Manges, MD{   Thank you for choosing [facility]!!   [phone]  Other Instructions
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
## 2022-10-17 ENCOUNTER — Ambulatory Visit: Payer: Medicare Other | Admitting: Dermatology

## 2022-10-19 DIAGNOSIS — K080 Exfoliation of teeth due to systemic causes: Secondary | ICD-10-CM | POA: Diagnosis not present

## 2022-12-14 DIAGNOSIS — K080 Exfoliation of teeth due to systemic causes: Secondary | ICD-10-CM | POA: Diagnosis not present

## 2022-12-19 ENCOUNTER — Other Ambulatory Visit (HOSPITAL_BASED_OUTPATIENT_CLINIC_OR_DEPARTMENT_OTHER): Payer: Self-pay

## 2022-12-19 MED ORDER — FLUAD 0.5 ML IM SUSY
PREFILLED_SYRINGE | INTRAMUSCULAR | 0 refills | Status: DC
  Filled 2022-12-19: qty 0.5, 1d supply, fill #0

## 2023-01-03 ENCOUNTER — Other Ambulatory Visit: Payer: Self-pay | Admitting: Internal Medicine

## 2023-03-10 ENCOUNTER — Ambulatory Visit (INDEPENDENT_AMBULATORY_CARE_PROVIDER_SITE_OTHER): Payer: Medicare Other | Admitting: Family Medicine

## 2023-03-10 ENCOUNTER — Encounter: Payer: Self-pay | Admitting: Family Medicine

## 2023-03-10 VITALS — BP 132/70 | HR 71 | Temp 98.0°F | Resp 16 | Ht 75.0 in | Wt 237.2 lb

## 2023-03-10 DIAGNOSIS — Z0000 Encounter for general adult medical examination without abnormal findings: Secondary | ICD-10-CM

## 2023-03-10 DIAGNOSIS — I10 Essential (primary) hypertension: Secondary | ICD-10-CM | POA: Diagnosis not present

## 2023-03-10 DIAGNOSIS — Z125 Encounter for screening for malignant neoplasm of prostate: Secondary | ICD-10-CM | POA: Diagnosis not present

## 2023-03-10 LAB — CBC
HCT: 44.4 % (ref 39.0–52.0)
Hemoglobin: 14.7 g/dL (ref 13.0–17.0)
MCHC: 33 g/dL (ref 30.0–36.0)
MCV: 93.9 fL (ref 78.0–100.0)
Platelets: 257 10*3/uL (ref 150.0–400.0)
RBC: 4.73 Mil/uL (ref 4.22–5.81)
RDW: 12.8 % (ref 11.5–15.5)
WBC: 6.1 10*3/uL (ref 4.0–10.5)

## 2023-03-10 LAB — COMPREHENSIVE METABOLIC PANEL
ALT: 25 U/L (ref 0–53)
AST: 21 U/L (ref 0–37)
Albumin: 4.4 g/dL (ref 3.5–5.2)
Alkaline Phosphatase: 67 U/L (ref 39–117)
BUN: 12 mg/dL (ref 6–23)
CO2: 32 meq/L (ref 19–32)
Calcium: 8.9 mg/dL (ref 8.4–10.5)
Chloride: 99 meq/L (ref 96–112)
Creatinine, Ser: 0.98 mg/dL (ref 0.40–1.50)
GFR: 76.99 mL/min (ref >60.00)
Glucose, Bld: 105 mg/dL — ABNORMAL HIGH (ref 70–99)
Potassium: 3.8 meq/L (ref 3.5–5.1)
Sodium: 141 meq/L (ref 135–145)
Total Bilirubin: 0.8 mg/dL (ref 0.2–1.2)
Total Protein: 6.8 g/dL (ref 6.0–8.3)

## 2023-03-10 LAB — LIPID PANEL
Cholesterol: 145 mg/dL (ref 0–200)
HDL: 52.2 mg/dL (ref >39.00)
LDL Cholesterol: 70 mg/dL (ref 0–99)
NonHDL: 93.27
Total CHOL/HDL Ratio: 3
Triglycerides: 114 mg/dL (ref 0.0–149.0)
VLDL: 22.8 mg/dL (ref 0.0–40.0)

## 2023-03-10 LAB — PSA, MEDICARE: PSA: 0.95 ng/mL (ref 0.10–4.00)
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
## 2023-03-10 NOTE — Patient Instructions (Addendum)
Give [place] 2-3 business days to get the results of your labs back.   Keep the diet clean and stay active.  Tell the blood drive workers you deal with eczema/atopic dermatitis.  Let us know if you need anything.

## 2023-03-30 ENCOUNTER — Other Ambulatory Visit: Payer: Self-pay | Admitting: Internal Medicine

## 2023-04-03 ENCOUNTER — Other Ambulatory Visit: Payer: Self-pay | Admitting: Internal Medicine

## 2023-04-03 DIAGNOSIS — D485 Neoplasm of uncertain behavior of skin: Secondary | ICD-10-CM | POA: Diagnosis not present

## 2023-04-03 DIAGNOSIS — D1801 Hemangioma of skin and subcutaneous tissue: Secondary | ICD-10-CM | POA: Diagnosis not present

## 2023-04-03 DIAGNOSIS — Z85828 Personal history of other malignant neoplasm of skin: Secondary | ICD-10-CM | POA: Diagnosis not present

## 2023-04-03 DIAGNOSIS — L821 Other seborrheic keratosis: Secondary | ICD-10-CM | POA: Diagnosis not present

## 2023-04-03 DIAGNOSIS — L905 Scar conditions and fibrosis of skin: Secondary | ICD-10-CM | POA: Diagnosis not present

## 2023-04-03 DIAGNOSIS — D225 Melanocytic nevi of trunk: Secondary | ICD-10-CM | POA: Diagnosis not present

## 2023-04-04 ENCOUNTER — Other Ambulatory Visit: Payer: Self-pay

## 2023-04-04 ENCOUNTER — Encounter: Payer: Self-pay | Admitting: Internal Medicine

## 2023-04-04 MED ORDER — DILTIAZEM HCL ER COATED BEADS 180 MG PO CP24
180.0000 mg | ORAL_CAPSULE | Freq: Every day | ORAL | 1 refills | Status: DC
  Filled 2023-04-04: qty 90, 90d supply, fill #0

## 2023-04-05 ENCOUNTER — Other Ambulatory Visit: Payer: Self-pay

## 2023-04-05 MED ORDER — DILTIAZEM HCL ER COATED BEADS 180 MG PO CP24: 180.0000 mg | ORAL_CAPSULE | Freq: Every day | ORAL | 1 refills | Status: DC

## 2023-04-05 NOTE — Addendum Note (Signed)
Addended by: Rodman Key E on: 04/05/2023 10:00 AM   Modules accepted: Orders

## 2023-04-06 ENCOUNTER — Other Ambulatory Visit: Payer: Self-pay | Admitting: Family Medicine

## 2023-04-07 ENCOUNTER — Encounter: Payer: Self-pay | Admitting: Family Medicine

## 2023-04-07 DIAGNOSIS — H524 Presbyopia: Secondary | ICD-10-CM | POA: Diagnosis not present

## 2023-04-10 ENCOUNTER — Other Ambulatory Visit: Payer: Self-pay

## 2023-04-10 MED ORDER — FLUTICASONE PROPIONATE 50 MCG/ACT NA SUSP: 2.0000 | Freq: Every day | NASAL | 2 refills | Status: DC

## 2023-04-11 ENCOUNTER — Other Ambulatory Visit: Payer: Self-pay

## 2023-04-11 MED ORDER — FLUTICASONE PROPIONATE 50 MCG/ACT NA SUSP: 2.0000 | Freq: Every day | NASAL | 2 refills | Status: AC

## 2023-04-26 DIAGNOSIS — K080 Exfoliation of teeth due to systemic causes: Secondary | ICD-10-CM | POA: Diagnosis not present

## 2023-05-25 ENCOUNTER — Ambulatory Visit: Payer: Medicare Other

## 2023-05-25 VITALS — Ht 72.0 in | Wt 237.0 lb

## 2023-05-25 DIAGNOSIS — Z0000 Encounter for general adult medical examination without abnormal findings: Secondary | ICD-10-CM
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
## 2023-05-25 NOTE — Patient Instructions (Addendum)
 [name] , Thank you for taking time to come for your Medicare Wellness Visit. I appreciate your ongoing commitment to your health goals. Please review the following plan we discussed and let me know if I can assist you in the future.   Referrals/Orders/Follow-Ups/Clinician Recommendations:   This is a list of the screening recommended for you and due dates:  Health Maintenance  Topic Date Due   Medicare Annual Wellness Visit  05/24/2024   Colon Cancer Screening  09/01/2024   DTaP/Tdap/Td vaccine (3 - Td or Tdap) 02/09/2032   Pneumonia Vaccine  Completed   Flu Shot  Completed   Hepatitis C Screening  Completed   Zoster (Shingles) Vaccine  Completed   HPV Vaccine  Aged Out   COVID-19 Vaccine  Discontinued    Advanced directives: (In Chart) A copy of your advanced directives are scanned into your chart should your provider ever need it.  Next Medicare Annual Wellness Visit scheduled for next year: Yes

## 2023-06-01 DIAGNOSIS — M1711 Unilateral primary osteoarthritis, right knee: Secondary | ICD-10-CM | POA: Diagnosis not present

## 2023-06-08 DIAGNOSIS — M1711 Unilateral primary osteoarthritis, right knee: Secondary | ICD-10-CM | POA: Diagnosis not present

## 2023-06-15 DIAGNOSIS — M1711 Unilateral primary osteoarthritis, right knee: Secondary | ICD-10-CM | POA: Diagnosis not present

## 2023-06-28 ENCOUNTER — Other Ambulatory Visit: Payer: Self-pay | Admitting: Internal Medicine

## 2023-08-17 NOTE — Progress Notes (Unsigned)
  Electrophysiology Office Note:   Date:  08/18/2023  ID:  [name], DOB 07/24/1950, MRN [id number]  Primary Cardiologist: Richardo Chandler, MD Electrophysiologist: None      History of Present Illness:   [name] is a 73 y.o. male with h/o SVT and HTN seen today for routine electrophysiology followup.   Since last being seen in our clinic the patient reports doing well. Overall, he denies chest pain, dyspnea, PND, orthopnea, nausea, vomiting, dizziness, syncope, edema, weight gain, or early satiety.  No sustained palpitations. States every now and then it "feels like it's going to race" but stops.   Review of systems complete and found to be negative unless listed in HPI.   EP Information / Studies Reviewed:    EKG is ordered today. Personal review as below.  EKG Interpretation Date/Time:  Friday August 18 2023 09:35:21 EDT Ventricular Rate:  73 PR Interval:  150 QRS Duration:  144 QT Interval:  430 QTC Calculation: 473 R Axis:   -76  Text Interpretation: Normal sinus rhythm Right bundle branch block Left anterior fascicular block Bifascicular block When compared with ECG of 31-Aug-2022 10:54, No significant change was found Confirmed by Pilar Bridge [phone]) on 08/18/2023 9:45:41 AM    Arrhythmia/Device History No specialty comments available.   Physical Exam:   VS:  BP (!) 151/58   Pulse 69   Ht 6' (1.829 m)   Wt 238 lb (108 kg)   SpO2 93%   BMI 32.28 kg/m    Wt Readings from Last 3 Encounters:  08/18/23 238 lb (108 kg)  05/25/23 237 lb (107.5 kg)  03/10/23 237 lb 3.2 oz (107.6 kg)     GEN: No acute distress NECK: No JVD; No carotid bruits CARDIAC: Regular rate and rhythm, no murmurs, rubs, gallops RESPIRATORY:  Clear to auscultation without rales, wheezing or rhonchi  ABDOMEN: Soft, non-tender, non-distended EXTREMITIES:  No edema; No deformity   ASSESSMENT AND PLAN:    SVT Without recurrence  HTN Stable on current regimen   HLD Continue  statin   Brother with h/o aortic dissection Lung Nodule on CTA 10/2021 Pt has had negative CTA.  Will order Non contrast CT to follow up on Lung nodule per Radiology.  Follow up with EP Team in 12 months.  He has not had SVT in many years.  If willing, his PCP could take over his diltiazem  and he could see EP prn only.   Signed, Tylene Galla, PA-C

## 2023-08-18 ENCOUNTER — Ambulatory Visit: Attending: Student | Admitting: Student

## 2023-08-18 ENCOUNTER — Ambulatory Visit: Admitting: Internal Medicine

## 2023-08-18 ENCOUNTER — Encounter: Payer: Self-pay | Admitting: Student

## 2023-08-18 VITALS — BP 151/58 | HR 69 | Ht 72.0 in | Wt 238.0 lb

## 2023-08-18 DIAGNOSIS — I471 Supraventricular tachycardia, unspecified: Secondary | ICD-10-CM | POA: Diagnosis not present

## 2023-08-18 DIAGNOSIS — I10 Essential (primary) hypertension: Secondary | ICD-10-CM | POA: Diagnosis not present

## 2023-08-18 DIAGNOSIS — R911 Solitary pulmonary nodule: Secondary | ICD-10-CM | POA: Diagnosis not present

## 2023-08-18 NOTE — Patient Instructions (Signed)
 Medication Instructions:  No medication changes today. *If you need a refill on your cardiac medications before your next appointment, please call your pharmacy*  Lab Work: No labwork ordered today. If you have labs (blood work) drawn today and your tests are completely normal, you will receive your results only by: MyChart Message (if you have MyChart) OR A paper copy in the mail If you have any lab test that is abnormal or we need to change your treatment, we will call you to review the results.  Testing/Procedures: Non-Contrast CT ordered today to follow up on pulmonary nodule.  Follow-Up: At [facility], you and your health needs are our priority.  As part of our continuing mission to provide you with exceptional heart care, our providers are all part of one team.  This team includes your primary Cardiologist (physician) and Advanced Practice Providers or APPs (Physician Assistants and Nurse Practitioners) who all work together to provide you with the care you need, when you need it.  Your next appointment:   12 month(s)  Provider:   You may see a new MD or one of the following Advanced Practice Providers on your designated Care Team:   Mertha Abrahams, Kennard Pea "Jonelle Neri" [place], PA-C Suzann Riddle, NP Creighton Doffing, NP    We recommend signing up for the patient portal called "MyChart".  Sign up information is provided on this After Visit Summary.  MyChart is used to connect with patients for Virtual Visits (Telemedicine).  Patients are able to view lab/test results, encounter notes, upcoming appointments, etc.  Non-urgent messages can be sent to your provider as well.   To learn more about what you can do with MyChart, go to [email or web address].

## 2023-08-25 ENCOUNTER — Ambulatory Visit: Admitting: Internal Medicine

## 2023-08-28 DIAGNOSIS — I471 Supraventricular tachycardia, unspecified: Secondary | ICD-10-CM | POA: Diagnosis not present

## 2023-08-28 DIAGNOSIS — R002 Palpitations: Secondary | ICD-10-CM | POA: Diagnosis not present

## 2023-08-28 DIAGNOSIS — E785 Hyperlipidemia, unspecified: Secondary | ICD-10-CM | POA: Diagnosis not present

## 2023-08-28 DIAGNOSIS — I10 Essential (primary) hypertension: Secondary | ICD-10-CM | POA: Diagnosis not present

## 2023-08-30 ENCOUNTER — Encounter: Payer: Self-pay | Admitting: Cardiology

## 2023-09-05 ENCOUNTER — Ambulatory Visit: Attending: Cardiology | Admitting: Cardiology

## 2023-09-05 ENCOUNTER — Ambulatory Visit

## 2023-09-05 ENCOUNTER — Encounter: Payer: Self-pay | Admitting: Cardiology

## 2023-09-05 VITALS — BP 138/62 | HR 80 | Ht 72.0 in | Wt 235.0 lb

## 2023-09-05 DIAGNOSIS — R911 Solitary pulmonary nodule: Secondary | ICD-10-CM | POA: Diagnosis not present

## 2023-09-05 DIAGNOSIS — I10 Essential (primary) hypertension: Secondary | ICD-10-CM | POA: Diagnosis not present

## 2023-09-05 DIAGNOSIS — I471 Supraventricular tachycardia, unspecified: Secondary | ICD-10-CM

## 2023-09-05 DIAGNOSIS — R002 Palpitations: Secondary | ICD-10-CM

## 2023-09-05 NOTE — Progress Notes (Unsigned)
 Electrophysiology Office Note:   Date:  09/06/2023  ID:  [name], DOB 11/23/1950, MRN [id number]  Primary Cardiologist: Elspeth Sage, MD Electrophysiologist: Fonda Kitty, MD      History of Present Illness:   [name] is a 73 y.o. male with h/o HTN and SVT who is being seen today for EP evaluation.   Discussed the use of AI scribe software for clinical note transcription with the patient, who gave verbal consent to proceed.  History of Present Illness He experienced an episode of tachycardia and palpitations on Monday before last, with a heart rate reaching 136 beats per minute and blood pressure at 150/80 mmHg. During the episode, he felt lightheaded and weak, and these symptoms have persisted. He took an extra dose of diltiazem , which he usually takes in the morning, and by the time he reached the ED, the episode had resolved.  He has a history of supraventricular tachycardia, with previous episodes documented, including one in 2009. He experiences occasional episodes where his heart beats rapidly for a few seconds before settling down, occurring at least once a week or every two to three days. His current medication regimen includes diltiazem , which he takes regularly to manage his condition.  Otherwise doing relatively well with no new or acute complaints.   Review of systems complete and found to be negative unless listed in HPI.   EP Information / Studies Reviewed:    EKG is not ordered today. EKG from 08/18/23 reviewed which showed sinus rhythm with RBBB, LAFB      EKG 12/17/2007: SVT   Coronary CTA 10/2021:  IMPRESSION: 1. Coronary calcium  score of 545. This was 52 percentile for age-, sex, and race-matched controls.   2. Normal coronary origin with right dominance.   3. 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   4. Aortic atherosclerosis.   5. Dilated aortic root (4 cm).    Echo 08/2021:  1. Left ventricular ejection fraction, by  estimation, is 60 to 65%. The  left ventricle has normal function. The left ventricle has no regional  wall motion abnormalities. Left ventricular diastolic parameters were  normal.   2. Right ventricular systolic function is normal. The right ventricular  size is normal. There is normal pulmonary artery systolic pressure.   3. Left atrial size was mildly dilated.   4. The mitral valve is normal in structure. No evidence of mitral valve  regurgitation. No evidence of mitral stenosis.   5. The aortic valve is tricuspid. Aortic valve regurgitation is trivial.  No aortic stenosis is present.   6. Aortic dilatation noted. There is mild dilatation of the aortic root,  measuring 42 mm. There is moderate dilatation of the ascending aorta,  measuring 44 mm.   7. The inferior vena cava is normal in size with greater than 50%  respiratory variability, suggesting right atrial pressure of 3 mmHg.   8. Technically limited images due to poor soundwave transmission due to  patient's body habitus.       Physical Exam:   VS:  BP 138/62   Pulse 80   Ht 6' (1.829 m)   Wt 235 lb (106.6 kg)   SpO2 94%   BMI 31.87 kg/m    Wt Readings from Last 3 Encounters:  09/05/23 235 lb (106.6 kg)  08/18/23 238 lb (108 kg)  05/25/23 237 lb (107.5 kg)     GEN: Well nourished, well developed in no acute distress NECK: No JVD CARDIAC: Normal  rate, regular rhythm. RESPIRATORY:  Clear to auscultation without rales, wheezing or rhonchi  ABDOMEN: Soft, non-distended EXTREMITIES:  No edema; No deformity   ASSESSMENT AND PLAN:     #. SVT: Sustained and symptomatic. Recent ED visit.  Has had ED visits previously as well. #. Palpitations -Risk, benefits, and alternatives to EP study and radiofrequency ablation for SVT were also discussed in detail today. These risks include but are not limited to complete heart block, stroke, bleeding, vascular damage, tamponade, perforation, and death. The patient understands these  risk and wishes to think about his options further. - Continue diltiazem  180 mg once daily. - 2 week Zio monitor to assess burden.  #Hypertension -At goal today.  Recommend checking blood pressures 1-2 times per week at home and recording the values.  Recommend bringing these recordings to the primary care physician.  #Lung nodule: Discovered on CT coronary from 2023.  Patient is due for surveillance imaging. - Repeat CT scan has been ordered, but there was issues with insurance/scheduling.  We will place new order if necessary to facilitate.  Follow up with Dr. Kennyth in 8 weeks.   Signed, Fonda Kennyth, MD

## 2023-09-05 NOTE — Patient Instructions (Signed)
 Medication Instructions:  Your physician recommends that you continue on your current medications as directed. Please refer to the Current Medication list given to you today.  *If you need a refill on your cardiac medications before your next appointment, please call your pharmacy*  Testing/Procedures: Event Monitor  Your physician has recommended that you wear an event monitor. Event monitors are medical devices that record the heart's electrical activity. Doctors most often us  these monitors to diagnose arrhythmias. Arrhythmias are problems with the speed or rhythm of the heartbeat. The monitor is a small, portable device. You can wear one while you do your normal daily activities. This is usually used to diagnose what is causing palpitations/syncope (passing out).  Follow-Up: At [facility], you and your health needs are our priority.  As part of our continuing mission to provide you with exceptional heart care, our providers are all part of one team.  This team includes your primary Cardiologist (physician) and Advanced Practice Providers or APPs (Physician Assistants and Nurse Practitioners) who all work together to provide you with the care you need, when you need it.  Your next appointment:   6-8 weeks  Provider:   Fonda Kitty, MD

## 2023-09-05 NOTE — Progress Notes (Unsigned)
 Enrolled patient for a 14 day Zio XT  monitor to be mailed to patients home

## 2023-09-06 ENCOUNTER — Encounter: Payer: Self-pay | Admitting: Cardiology

## 2023-09-19 ENCOUNTER — Ambulatory Visit (HOSPITAL_COMMUNITY)
Admission: RE | Admit: 2023-09-19 | Discharge: 2023-09-19 | Disposition: A | Discharge status: Home / Self Care | Source: Ambulatory Visit | Attending: Student | Admitting: Student

## 2023-09-19 DIAGNOSIS — R911 Solitary pulmonary nodule: Secondary | ICD-10-CM | POA: Diagnosis not present

## 2023-09-19 DIAGNOSIS — I700 Atherosclerosis of aorta: Secondary | ICD-10-CM | POA: Diagnosis not present

## 2023-09-19 DIAGNOSIS — J439 Emphysema, unspecified: Secondary | ICD-10-CM | POA: Diagnosis not present

## 2023-09-21 ENCOUNTER — Other Ambulatory Visit: Payer: Self-pay | Admitting: Internal Medicine

## 2023-09-22 ENCOUNTER — Ambulatory Visit: Payer: Self-pay | Admitting: Student

## 2023-09-22 NOTE — Progress Notes (Signed)
 Great news; Incidentally found nodule is unchanged.    No further imaging or follow up indicated for the lung nodule.

## 2023-10-05 DIAGNOSIS — I471 Supraventricular tachycardia, unspecified: Secondary | ICD-10-CM | POA: Diagnosis not present

## 2023-10-13 DIAGNOSIS — I471 Supraventricular tachycardia, unspecified: Secondary | ICD-10-CM

## 2023-10-15 ENCOUNTER — Ambulatory Visit: Payer: Self-pay | Admitting: Cardiology

## 2023-10-22 NOTE — Progress Notes (Signed)
 Electrophysiology Office Note:   Date:  10/23/2023  ID:  [name], DOB 1950-12-11, MRN [id number]  Primary Cardiologist: Elspeth Sage, MD Electrophysiologist: Fonda Kitty, MD      History of Present Illness:   [name] is a 73 y.o. male with h/o HTN and SVT who is being seen today for EP evaluation.   Discussed the use of AI scribe software for clinical note transcription with the patient, who gave verbal consent to proceed.  History of Present Illness [name] is a 73 year old male with arrhythmia who presents for follow-up on recent episodes and monitoring results.  Last saw patient on 09/05/2023.  At that time, he had had a recent episode of SVT prompting an ED visit after symptoms persisted for hours.  During our last visit, we decided to wear a ZIO monitor in efforts to assess overall arrhythmia burden.  He had 11 nonsustained episodes over an approximate 9-day period.  He has thought further about EP study since our last visit. He is currently taking diltiazem  (Cardizem ). He experiences these episodes occasionally, particularly when active, but they do not seem to be as severe as past episodes.  During the review of symptoms, he denies prolonged episodes of arrhythmia but acknowledges feeling his heart 'pounding' and 'skipping' during episodes. He also mentions occasional dizziness and lightheadedness, but no significant chest pain or syncope.   Review of systems complete and found to be negative unless listed in HPI.   EP Information / Studies Reviewed:    EKG is not ordered today. EKG from 08/18/23 reviewed which showed sinus rhythm with RBBB, LAFB      EKG 12/17/2007: SVT   Zio 09/2023:  Patch Wear Time:  9 days and 19 hours (2025-07-09T16:19:43-0400 to 2025-07-19T12:04:29-0400)   HR 43 - 162, average 72 bpm. 11 nonsustained SVT (longest 13 beats). These were not associated with symptom triggers. No atrial fibrillation detected. Occasional  supraventricular ectopy, 1.5%. Rare ventricular ectopy. No sustained arrhythmias. There was 1 symptom trigger episode, which corresponds to accelerated junctional rhythm.  Coronary CTA 10/2021:  IMPRESSION: 1. Coronary calcium  score of 545. This was 46 percentile for age-, sex, and race-matched controls.   2. Normal coronary origin with right dominance.   3. 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   4. Aortic atherosclerosis.   5. Dilated aortic root (4 cm).    Echo 08/2021:  1. Left ventricular ejection fraction, by estimation, is 60 to 65%. The  left ventricle has normal function. The left ventricle has no regional  wall motion abnormalities. Left ventricular diastolic parameters were  normal.   2. Right ventricular systolic function is normal. The right ventricular  size is normal. There is normal pulmonary artery systolic pressure.   3. Left atrial size was mildly dilated.   4. The mitral valve is normal in structure. No evidence of mitral valve  regurgitation. No evidence of mitral stenosis.   5. The aortic valve is tricuspid. Aortic valve regurgitation is trivial.  No aortic stenosis is present.   6. Aortic dilatation noted. There is mild dilatation of the aortic root,  measuring 42 mm. There is moderate dilatation of the ascending aorta,  measuring 44 mm.   7. The inferior vena cava is normal in size with greater than 50%  respiratory variability, suggesting right atrial pressure of 3 mmHg.   8. Technically limited images due to poor soundwave transmission due to  patient's body habitus.  Physical Exam:   VS:  BP 128/68 (BP Location: Right Arm, Patient Position: Sitting, Cuff Size: Large)   Pulse 62   Ht 6' (1.829 m)   Wt 234 lb (106.1 kg)   SpO2 96%   BMI 31.74 kg/m    Wt Readings from Last 3 Encounters:  10/23/23 234 lb (106.1 kg)  09/05/23 235 lb (106.6 kg)  08/18/23 238 lb (108 kg)     GEN: Well nourished, well developed in no acute  distress NECK: No JVD CARDIAC: Normal rate, regular rhythm. RESPIRATORY:  Clear to auscultation without rales, wheezing or rhonchi  ABDOMEN: Soft, non-distended EXTREMITIES:  No edema; No deformity   ASSESSMENT AND PLAN:     #. SVT: Sustained and symptomatic. Recent ED visit.  Has had ED visits previously as well.  Recently wore 2-week ZIO monitor with only nonsustained episodes. #. Palpitations - Risk, benefits, and alternatives to EP study and radiofrequency ablation for SVT were also discussed in detail today. These risks include but are not limited to complete heart block, stroke, bleeding, vascular damage, tamponade, perforation, and death. The patient understands these risks and would like to proceed. - Continue diltiazem  180 mg once daily for now.  #Hypertension -At goal today.  Recommend checking blood pressures 1-2 times per week at home and recording the values.  Recommend bringing these recordings to the primary care physician.  #Lung nodule: Discovered on CT coronary from 2023.  Patient was due for surveillance imaging during last visit.  CT performed 09/19/2023. - Unchanged on recent CT scan.  No follow-up recommended.  Follow up with Dr. Kennyth 3 months after ablation.   Signed, Fonda Kennyth, MD

## 2023-10-23 ENCOUNTER — Ambulatory Visit: Attending: Cardiology | Admitting: Cardiology

## 2023-10-23 ENCOUNTER — Encounter: Payer: Self-pay | Admitting: Cardiology

## 2023-10-23 ENCOUNTER — Other Ambulatory Visit: Payer: Self-pay

## 2023-10-23 VITALS — BP 128/68 | HR 62 | Ht 72.0 in | Wt 234.0 lb

## 2023-10-23 DIAGNOSIS — I10 Essential (primary) hypertension: Secondary | ICD-10-CM

## 2023-10-23 DIAGNOSIS — R002 Palpitations: Secondary | ICD-10-CM

## 2023-10-23 DIAGNOSIS — I471 Supraventricular tachycardia, unspecified: Secondary | ICD-10-CM

## 2023-10-23 NOTE — Patient Instructions (Addendum)
 Medication Instructions:  Your physician recommends that you continue on your current medications as directed. Please refer to the Current Medication list given to you today.  *If you need a refill on your cardiac medications before your next appointment, please call your pharmacy*  Lab Work: BMET and CBC - please go to any [facility] location to have these drawn the week of October 6th   Testing/Procedures: Ablation  Your physician has recommended that you have an ablation. Catheter ablation is a medical procedure used to treat some cardiac arrhythmias (irregular heartbeats). During catheter ablation, a long, thin, flexible tube is put into a blood vessel in your groin (upper thigh), or neck. This tube is called an ablation catheter. It is then guided to your heart through the blood vessel. Radio frequency waves destroy small areas of heart tissue where abnormal heartbeats may cause an arrhythmia to start.  You are scheduled for SVT Ablation on Friday, October 17 with Dr. Sidra Kitty.Please arrive at the Main Entrance A at [facility]: [address] [place], [place] [address] at 10:30 AM     Follow-Up: At [facility], you and your health needs are our priority.  As part of our continuing mission to provide you with exceptional heart care, our providers are all part of one team.  This team includes your primary Cardiologist (physician) and Advanced Practice Providers or APPs (Physician Assistants and Nurse Practitioners) who all work together to provide you with the care you need, when you need it.  Your next appointment:   We will contact you to schedule your post procedure appointments

## 2023-11-08 DIAGNOSIS — K080 Exfoliation of teeth due to systemic causes: Secondary | ICD-10-CM | POA: Diagnosis not present

## 2023-12-04 NOTE — Progress Notes (Signed)
 [name]

## 2023-12-05 ENCOUNTER — Telehealth (HOSPITAL_COMMUNITY): Payer: Self-pay

## 2023-12-05 NOTE — Telephone Encounter (Signed)
 Spoke with patient to complete pre-procedure call.     Health status review:  Any new medical conditions, recent signs of acute illness or been started on antibiotics? No Any recent hospitalizations or surgeries? No Any new medications started since pre-op visit? No  Follow all medication instructions prior to procedure or the procedure may be rescheduled:    HOLD Diltiazem  for 5 days prior to procedure. Last dose on October 11. Essential chronic medications:  No medication should be continued, unless told otherwise. On the morning of your procedure DO NOT take any medication.  Nothing to eat or drink after midnight prior to your procedure.  Pre-procedure testing scheduled: lab work by October 3.  Confirmed patient is scheduled for SVT Ablation on Friday, October 17 with Dr. Sidra Kitty. Instructed patient to arrive at the Main Entrance A at [facility]: [address] [place], [place] [address] and check in at Admitting at 10:30 AM.  Advised of plan to go home the same day and will only stay overnight if medically necessary. You MUST have a responsible adult to drive you home and MUST be with you the first 24 hours after you arrive home or your procedure could be cancelled.  Informed patient a nurse will call a day before the procedure to confirm arrival time and ensure instructions are followed.  Patient verbalized understanding to information provided and is agreeable to proceed with procedure.   Advised patient to contact RN Navigator at [phone], to inform of any new medications started after call or concerns prior to procedure.

## 2023-12-14 LAB — BASIC METABOLIC PANEL WITH GFR
BUN/Creatinine Ratio: 10 (ref 10–24)
BUN: 9 mg/dL (ref 8–27)
CO2: 23 mmol/L (ref 20–29)
Calcium: 9.5 mg/dL (ref 8.6–10.2)
Chloride: 96 mmol/L (ref 96–106)
Creatinine, Ser: 0.88 mg/dL (ref 0.76–1.27)
Glucose: 90 mg/dL (ref 70–99)
Potassium: 4.6 mmol/L (ref 3.5–5.2)
Sodium: 139 mmol/L (ref 134–144)
eGFR: 91 mL/min/1.73 (ref >59)

## 2023-12-14 LAB — CBC
Hematocrit: 43 % (ref 37.5–51.0)
Hemoglobin: 14 g/dL (ref 13.0–17.7)
MCH: 31.1 pg (ref 26.6–33.0)
MCHC: 32.6 g/dL (ref 31.5–35.7)
MCV: 96 fL (ref 79–97)
Platelets: 307 x10E3/uL (ref 150–450)
RBC: 4.5 x10E6/uL (ref 4.14–5.80)
RDW: 12.2 % (ref 11.6–15.4)
WBC: 8.7 x10E3/uL (ref 3.4–10.8)

## 2023-12-17 ENCOUNTER — Ambulatory Visit: Payer: Self-pay | Admitting: Cardiology

## 2023-12-28 NOTE — Pre-Procedure Instructions (Signed)
 Instructed patient on the following items: ?Arrival time 1030 ?Nothing to eat or drink after midnight ?No meds AM of procedure ?Responsible person to drive you home and stay with you for 24 hrs ? ? ?   ?

## 2023-12-29 ENCOUNTER — Encounter (HOSPITAL_COMMUNITY)
Admission: RE | Disposition: A | Discharge status: Home / Self Care | Payer: Self-pay | Source: Home / Self Care | Attending: Cardiology

## 2023-12-29 ENCOUNTER — Ambulatory Visit (HOSPITAL_COMMUNITY): Admitting: Anesthesiology

## 2023-12-29 ENCOUNTER — Other Ambulatory Visit: Payer: Self-pay

## 2023-12-29 ENCOUNTER — Ambulatory Visit (HOSPITAL_COMMUNITY)
Admission: RE | Admit: 2023-12-29 | Discharge: 2023-12-29 | Disposition: A | Discharge status: Home / Self Care | Attending: Cardiology | Admitting: Cardiology

## 2023-12-29 DIAGNOSIS — Z87891 Personal history of nicotine dependence: Secondary | ICD-10-CM | POA: Insufficient documentation

## 2023-12-29 DIAGNOSIS — R002 Palpitations: Secondary | ICD-10-CM | POA: Diagnosis not present

## 2023-12-29 DIAGNOSIS — I471 Supraventricular tachycardia, unspecified: Secondary | ICD-10-CM | POA: Diagnosis not present

## 2023-12-29 DIAGNOSIS — I10 Essential (primary) hypertension: Secondary | ICD-10-CM | POA: Diagnosis not present

## 2023-12-29 DIAGNOSIS — Z79899 Other long term (current) drug therapy: Secondary | ICD-10-CM | POA: Insufficient documentation

## 2023-12-29 DIAGNOSIS — F419 Anxiety disorder, unspecified: Secondary | ICD-10-CM | POA: Diagnosis not present

## 2023-12-29 HISTORY — PX: SVT ABLATION: EP1225

## 2023-12-29 SURGERY — SVT ABLATION
Anesthesia: Monitor Anesthesia Care

## 2023-12-29 MED ORDER — SODIUM CHLORIDE 0.9 % IV SOLN: INTRAVENOUS | Status: DC

## 2023-12-29 MED ORDER — FENTANYL CITRATE (PF) 100 MCG/2ML IJ SOLN
INTRAMUSCULAR | Status: AC
  Filled 2023-12-29: qty 2

## 2023-12-29 MED ORDER — CEFAZOLIN SODIUM-DEXTROSE 2-4 GM/100ML-% IV SOLN
INTRAVENOUS | Status: AC
  Filled 2023-12-29: qty 100

## 2023-12-29 MED ORDER — SODIUM CHLORIDE 0.9 % IV SOLN: 250.0000 mL | INTRAVENOUS | Status: DC | PRN

## 2023-12-29 MED ORDER — ISOPROTERENOL HCL 0.2 MG/ML IJ SOLN
INTRAVENOUS | Status: DC | PRN
  Administered 2023-12-29: 2 ug/min via INTRAVENOUS

## 2023-12-29 MED ORDER — PROPOFOL 500 MG/50ML IV EMUL
INTRAVENOUS | Status: DC | PRN
  Administered 2023-12-29: 100 ug/kg/min via INTRAVENOUS

## 2023-12-29 MED ORDER — SODIUM CHLORIDE 0.9% FLUSH: 3.0000 mL | Freq: Two times a day (BID) | INTRAVENOUS | Status: DC

## 2023-12-29 MED ORDER — ISOPROTERENOL HCL 0.2 MG/ML IJ SOLN
INTRAMUSCULAR | Status: AC
  Filled 2023-12-29: qty 5

## 2023-12-29 MED ORDER — ACETAMINOPHEN 325 MG PO TABS: 650.0000 mg | ORAL_TABLET | ORAL | Status: DC | PRN

## 2023-12-29 MED ORDER — FENTANYL CITRATE (PF) 100 MCG/2ML IJ SOLN
INTRAMUSCULAR | Status: DC | PRN
  Administered 2023-12-29: 50 ug via INTRAVENOUS

## 2023-12-29 MED ORDER — ONDANSETRON HCL 4 MG/2ML IJ SOLN: 4.0000 mg | Freq: Four times a day (QID) | INTRAMUSCULAR | Status: DC | PRN

## 2023-12-29 MED ORDER — SODIUM CHLORIDE 0.9% FLUSH: 3.0000 mL | INTRAVENOUS | Status: DC | PRN

## 2023-12-29 MED ORDER — BUPIVACAINE HCL (PF) 0.25 % IJ SOLN
INTRAMUSCULAR | Status: DC | PRN
  Administered 2023-12-29: 20 mL

## 2023-12-29 MED ORDER — HEPARIN (PORCINE) IN NACL 1000-0.9 UT/500ML-% IV SOLN
INTRAVENOUS | Status: DC | PRN
  Administered 2023-12-29: 500 mL

## 2023-12-29 MED ORDER — CEFAZOLIN SODIUM-DEXTROSE 2-3 GM-%(50ML) IV SOLR
INTRAVENOUS | Status: DC | PRN
  Administered 2023-12-29: 2 g via INTRAVENOUS

## 2023-12-29 MED FILL — Bupivacaine HCl Preservative Free (PF) Inj 0.25%: INTRAMUSCULAR | Qty: 30 | Status: AC

## 2023-12-29 MED FILL — Propofol IV Emul 1000 MG/100ML (10 MG/ML): INTRAVENOUS | Qty: 100 | Status: AC

## 2023-12-29 SURGICAL SUPPLY — 10 items
CATH CRD2 QUAD 6FR 5 (CATHETERS) IMPLANT
CATH DECANAV F CURVE (CATHETERS) IMPLANT
CATH JOSEPH QUAD ALLRED 6F REP (CATHETERS) IMPLANT
CLOSURE PERCLOSE PROSTYLE (Vascular Products) IMPLANT
MAT PREVALON FULL STRYKER (MISCELLANEOUS) IMPLANT
PACK EP LF (CUSTOM PROCEDURE TRAY) ×1 IMPLANT
PAD DEFIB RADIO PHYSIO CONN (PAD) ×1 IMPLANT
PATCH CARTO3 (PAD) IMPLANT
SHEATH PINNACLE 7F 10CM (SHEATH) IMPLANT
SHEATH PINNACLE 8F 10CM (SHEATH) IMPLANT

## 2023-12-29 NOTE — H&P (Signed)
 Electrophysiology Note:   Date: 12/29/23 ID:  [name], DOB [birth year], MRN [id number]   Primary Cardiologist: Elspeth Sage, MD Electrophysiologist: Fonda Kitty, MD       History of Present Illness:   [name] is a 73 y.o. male with h/o HTN and SVT who is being seen today for EP evaluation.    Discussed the use of AI scribe software for clinical note transcription with the patient, who gave verbal consent to proceed.   History of Present Illness [name] is a 73 year old male with arrhythmia who presents for follow-up on recent episodes and monitoring results.  Last saw patient on 09/05/2023.  At that time, he had had a recent episode of SVT prompting an ED visit after symptoms persisted for hours.  During our last visit, we decided to wear a ZIO monitor in efforts to assess overall arrhythmia burden.  He had 11 nonsustained episodes over an approximate 9-day period.  He has thought further about EP study since our last visit. He is currently taking diltiazem  (Cardizem ). He experiences these episodes occasionally, particularly when active, but they do not seem to be as severe as past episodes.   During the review of symptoms, he denies prolonged episodes of arrhythmia but acknowledges feeling his heart 'pounding' and 'skipping' during episodes. He also mentions occasional dizziness and lightheadedness, but no significant chest pain or syncope.   Interval: Patient presents today for planned ablation. Reports feeling relatively well. No new or acute complaints.   Review of systems complete and found to be negative unless listed in HPI.    EP Information / Studies Reviewed:     EKG is not ordered today. EKG from 08/18/23 reviewed which showed sinus rhythm with RBBB, LAFB        EKG 12/17/2007: SVT    Zio 09/2023:  Patch Wear Time:  9 days and 19 hours (2025-07-09T16:19:43-0400 to 2025-07-19T12:04:29-0400)   HR 43 - 162, average 72 bpm. 11 nonsustained  SVT (longest 13 beats). These were not associated with symptom triggers. No atrial fibrillation detected. Occasional supraventricular ectopy, 1.5%. Rare ventricular ectopy. No sustained arrhythmias. There was 1 symptom trigger episode, which corresponds to accelerated junctional rhythm.   Coronary CTA 10/2021:  IMPRESSION: 1. Coronary calcium  score of 545. This was 15 percentile for age-, sex, and race-matched controls.   2. Normal coronary origin with right dominance.   3. 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   4. Aortic atherosclerosis.   5. Dilated aortic root (4 cm).     Echo 08/2021:  1. Left ventricular ejection fraction, by estimation, is 60 to 65%. The  left ventricle has normal function. The left ventricle has no regional  wall motion abnormalities. Left ventricular diastolic parameters were  normal.   2. Right ventricular systolic function is normal. The right ventricular  size is normal. There is normal pulmonary artery systolic pressure.   3. Left atrial size was mildly dilated.   4. The mitral valve is normal in structure. No evidence of mitral valve  regurgitation. No evidence of mitral stenosis.   5. The aortic valve is tricuspid. Aortic valve regurgitation is trivial.  No aortic stenosis is present.   6. Aortic dilatation noted. There is mild dilatation of the aortic root,  measuring 42 mm. There is moderate dilatation of the ascending aorta,  measuring 44 mm.   7. The inferior vena cava is normal in size with greater than 50%  respiratory variability,  suggesting right atrial pressure of 3 mmHg.   8. Technically limited images due to poor soundwave transmission due to  patient's body habitus.        Physical Exam:    Today's Vitals   12/29/23 1034 12/29/23 1041  BP: (!) 155/69   Pulse: 79   Resp: 17   Temp: 98 F (36.7 C)   TempSrc: Oral   SpO2: 95%   Weight: 106.6 kg   Height: 6' (1.829 m)   PainSc:  0-No pain   Body mass index is  31.87 kg/m.   GEN: Well nourished, well developed in no acute distress NECK: No JVD CARDIAC: Normal rate, regular rhythm. RESPIRATORY:  Clear to auscultation without rales, wheezing or rhonchi  ABDOMEN: Soft, non-distended EXTREMITIES:  No edema; No deformity    ASSESSMENT AND PLAN:       #. SVT: Sustained and symptomatic. Recent ED visit.  Has had ED visits previously as well.  Recently wore 2-week ZIO monitor with only nonsustained episodes. #. Palpitations - Risk, benefits, and alternatives to EP study and radiofrequency ablation for SVT were also discussed in detail today. These risks include but are not limited to complete heart block, stroke, bleeding, vascular damage, tamponade, perforation, and death. The patient understands these risks and would like to proceed today. - Continue diltiazem  180 mg once daily for now.    Follow up with Dr. Kennyth 3 months after ablation.    Signed, Fonda Kennyth, MD

## 2023-12-29 NOTE — Transfer of Care (Signed)
 Immediate Anesthesia Transfer of Care Note  Patient: [name]  Procedure(s) Performed: SVT ABLATION  Patient Location: Cath Lab  Anesthesia Type:MAC  Level of Consciousness: awake, alert , and oriented  Airway & Oxygen Therapy: Patient Spontanous Breathing  Post-op Assessment: Report given to RN and Post -op Vital signs reviewed and stable  Post vital signs: Reviewed and stable  Last Vitals:  Vitals Value Taken Time  BP 145/78 12/29/23 14:16  Temp    Pulse 69 12/29/23 14:18  Resp 13 12/29/23 14:18  SpO2 97 % 12/29/23 14:18  Vitals shown include unfiled device data.  Last Pain:  Vitals:   12/29/23 1041  TempSrc:   PainSc: 0-No pain      Patients Stated Pain Goal: 4 (12/29/23 1041)  Complications: No notable events documented.

## 2023-12-29 NOTE — Discharge Instructions (Signed)

## 2023-12-29 NOTE — Progress Notes (Signed)
 Patient and patient wife given discharge instructions, education provided no further questions at this time. Patient able to ambulate and void before discharge. Able to tolerate PO intake. Patient site is clean, dry, intact and soft upon discharge. MD spoke with patient and family checked site, MD Kennyth states he is okay to d/c as long as he can ambulate, pee and is hemodynamically stable, all of which were achieved.

## 2023-12-29 NOTE — Anesthesia Preprocedure Evaluation (Addendum)
 Anesthesia Evaluation  Patient identified by MRN, date of birth, ID band Patient awake    Reviewed: Allergy & Precautions, H&P , NPO status , Patient's Chart, lab work & pertinent test results  Airway Mallampati: IV  TM Distance: >3 FB Neck ROM: Full    Dental no notable dental hx. (+) Teeth Intact, Dental Advisory Given   Pulmonary former smoker   Pulmonary exam normal breath sounds clear to auscultation       Cardiovascular hypertension, Pt. on medications + dysrhythmias Supra Ventricular Tachycardia  Rhythm:Regular Rate:Normal     Neuro/Psych   Anxiety     negative neurological ROS     GI/Hepatic negative GI ROS, Neg liver ROS,,,  Endo/Other  negative endocrine ROS    Renal/GU negative Renal ROS  negative genitourinary   Musculoskeletal  (+) Arthritis , Osteoarthritis,    Abdominal   Peds  Hematology negative hematology ROS (+)   Anesthesia Other Findings   Reproductive/Obstetrics negative OB ROS                              Anesthesia Physical Anesthesia Plan  ASA: 2  Anesthesia Plan: MAC   Post-op Pain Management: Minimal or no pain anticipated   Induction: Intravenous  PONV Risk Score and Plan: 1 and Propofol  infusion and Ondansetron   Airway Management Planned: Natural Airway and Simple Face Mask  Additional Equipment:   Intra-op Plan:   Post-operative Plan:   Informed Consent: I have reviewed the patients History and Physical, chart, labs and discussed the procedure including the risks, benefits and alternatives for the proposed anesthesia with the patient or authorized representative who has indicated his/her understanding and acceptance.     Dental advisory given  Plan Discussed with: CRNA  Anesthesia Plan Comments:          Anesthesia Quick Evaluation

## 2023-12-29 NOTE — Anesthesia Postprocedure Evaluation (Signed)
 Anesthesia Post Note  Patient: Leandre Wien Deignan  Procedure(s) Performed: SVT ABLATION     Patient location during evaluation: Cath Lab Anesthesia Type: MAC Level of consciousness: awake and alert Pain management: pain level controlled Vital Signs Assessment: post-procedure vital signs reviewed and stable Respiratory status: spontaneous breathing, nonlabored ventilation and respiratory function stable Cardiovascular status: stable and blood pressure returned to baseline Postop Assessment: no apparent nausea or vomiting Anesthetic complications: no   No notable events documented.  Last Vitals:  Vitals:   12/29/23 1446 12/29/23 1500  BP: 134/78 (!) 137/90  Pulse: (!) 59 61  Resp: 11 (!) 9  Temp:    SpO2: 96% 96%    Last Pain:  Vitals:   12/29/23 1426  TempSrc: Oral  PainSc:                  Alyxander Kollmann,W. EDMOND

## 2024-01-01 ENCOUNTER — Telehealth (HOSPITAL_COMMUNITY): Payer: Self-pay

## 2024-01-01 ENCOUNTER — Encounter (HOSPITAL_COMMUNITY): Payer: Self-pay | Admitting: Cardiology

## 2024-01-01 MED FILL — Propofol IV Emul 1000 MG/100ML (10 MG/ML): INTRAVENOUS | Qty: 50 | Status: AC

## 2024-01-01 MED FILL — Cefazolin Sodium-Dextrose IV Solution 2 GM/100ML-4%: INTRAVENOUS | Qty: 100 | Status: AC

## 2024-01-01 NOTE — Telephone Encounter (Signed)
 Spoke with patient to complete post procedure follow up call. EP study failed to induce tachycardia.   Patient reports no complications with groin sites.   Instructions reviewed with patient:  Remove large bandage at puncture site after 24 hours. It is normal to have bruising, tenderness, mild swelling, and a pea or marble sized lump/knot at the groin site which can take up to three months to resolve.  Get help right away if you notice sudden swelling at the puncture site.  Check your puncture site every day for signs of infection: fever, redness, swelling, pus drainage, warmth, foul odor or excessive pain. If this occurs, please call [phone], to speak with the RN Navigator. Get help right away if your puncture site is bleeding and the bleeding does not stop after applying firm pressure to the area.  You will follow up with the Dr. Dr. Kennyth 3 months after your procedure.   Patient verbalized understanding to all instructions provided.

## 2024-01-03 ENCOUNTER — Other Ambulatory Visit (HOSPITAL_BASED_OUTPATIENT_CLINIC_OR_DEPARTMENT_OTHER): Payer: Self-pay

## 2024-01-03 MED ORDER — FLUZONE HIGH-DOSE 0.5 ML IM SUSY
0.5000 mL | PREFILLED_SYRINGE | Freq: Once | INTRAMUSCULAR | 0 refills | Status: AC
  Filled 2024-01-03: qty 0.5, 1d supply, fill #0

## 2024-01-30 ENCOUNTER — Ambulatory Visit: Attending: Cardiology | Admitting: Cardiology

## 2024-01-30 ENCOUNTER — Encounter: Payer: Self-pay | Admitting: Cardiology

## 2024-01-30 VITALS — BP 142/60 | HR 64 | Ht 72.0 in | Wt 235.0 lb

## 2024-01-30 DIAGNOSIS — R002 Palpitations: Secondary | ICD-10-CM | POA: Diagnosis not present

## 2024-01-30 DIAGNOSIS — I471 Supraventricular tachycardia, unspecified: Secondary | ICD-10-CM | POA: Diagnosis not present

## 2024-01-30 DIAGNOSIS — I10 Essential (primary) hypertension: Secondary | ICD-10-CM | POA: Diagnosis not present

## 2024-01-30 DIAGNOSIS — I77810 Thoracic aortic ectasia: Secondary | ICD-10-CM

## 2024-01-30 DIAGNOSIS — I4510 Unspecified right bundle-branch block: Secondary | ICD-10-CM

## 2024-01-30 NOTE — Progress Notes (Signed)
 Electrophysiology Office Note:   Date:  01/30/2024  ID:  [name], DOB December 06, 1950, MRN [id number]  Primary Cardiologist: Elspeth Sage, MD (Inactive) Electrophysiologist: Fonda Kitty, MD      History of Present Illness:   [name] is a 73 y.o. male with h/o HTN and SVT who is being seen today for follow up evaluation.  Discussed the use of AI scribe software for clinical note transcription with the patient, who gave verbal consent to proceed.  History of Present Illness [name] is a 73 year old male who presents for routine follow-up after a cardiac procedure.  Patient had negative EP study on 12/29/2023.  There are no issues with the groin area post-procedure.  He experiences occasional episodes of heart rate fluttering, lasting seconds to half a minute, without associated symptoms such as chest pain or syncope. These episodes are infrequent and self-resolving, merely catching his attention.  He is currently on three medications, including diltiazem , which he uses to manage longer episodes by taking an extra dose.  No chest pain, syncope, or prolonged episodes of heart rate irregularity.   Review of systems complete and found to be negative unless listed in HPI.   EP Information / Studies Reviewed:    EKG is not ordered today. EKG from 12/29/23 reviewed which showed SR with RBBB     Zio 09/2023:  Patch Wear Time:  9 days and 19 hours (2025-07-09T16:19:43-0400 to 2025-07-19T12:04:29-0400)   HR 43 - 162, average 72 bpm. 11 nonsustained SVT (longest 13 beats). These were not associated with symptom triggers. No atrial fibrillation detected. Occasional supraventricular ectopy, 1.5%. Rare ventricular ectopy. No sustained arrhythmias. There was 1 symptom trigger episode, which corresponds to accelerated junctional rhythm.  Echo 08/2021:  1. Left ventricular ejection fraction, by estimation, is 60 to 65%. The  left ventricle has normal function.  The left ventricle has no regional  wall motion abnormalities. Left ventricular diastolic parameters were  normal.   2. Right ventricular systolic function is normal. The right ventricular  size is normal. There is normal pulmonary artery systolic pressure.   3. Left atrial size was mildly dilated.   4. The mitral valve is normal in structure. No evidence of mitral valve  regurgitation. No evidence of mitral stenosis.   5. The aortic valve is tricuspid. Aortic valve regurgitation is trivial.  No aortic stenosis is present.   6. Aortic dilatation noted. There is mild dilatation of the aortic root,  measuring 42 mm. There is moderate dilatation of the ascending aorta,  measuring 44 mm.   7. The inferior vena cava is normal in size with greater than 50%  respiratory variability, suggesting right atrial pressure of 3 mmHg.   8. Technically limited images due to poor soundwave transmission due to  patient's body habitus.   Coronary CTA 11/09/21: IMPRESSION: 1. Coronary calcium  score of 545. This was 45 percentile for age-, sex, and race-matched controls.   2. Normal coronary origin with right dominance.   3. 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   4. Aortic atherosclerosis.   5. Dilated aortic root (4 cm).   Physical Exam:   VS:  BP (!) 142/60 (BP Location: Left Arm, Patient Position: Sitting, Cuff Size: Large)   Pulse 64   Ht 6' (1.829 m)   Wt 235 lb (106.6 kg)   SpO2 95%   BMI 31.87 kg/m    Wt Readings from Last 3 Encounters:  01/30/24 235 lb (106.6 kg)  12/29/23 235 lb (106.6 kg)  10/23/23 234 lb (106.1 kg)     General: Well developed, in no acute distress.  Neck: No JVD.  Cardiac: Normal rate, regular rhythm.  Resp: Normal work of breathing.  Ext: No edema.  Neuro: No gross focal deficits.  Psych: Normal affect.   ASSESSMENT AND PLAN:    #. SVT: Negative EP study on 12/29/23. #. Palpitations - Continue diltiazem  180 mg once daily. If he has further  recurrence then we will consider AAD therapy for suppression. Would need to see documented sustained episodes before repeating EP study.   #Hypertension -At goal today.  Recommend checking blood pressures 1-2 times per week at home and recording the values.  Recommend bringing these recordings to the primary care physician.  #Ascending aortic dilatation: - Repeat echocardiogram for surveillance.    Follow up with EP Team as needed.   Signed, Fonda Kitty, MD

## 2024-01-30 NOTE — Patient Instructions (Signed)

## 2024-01-31 NOTE — Addendum Note (Signed)
 Addended by: CHAUVIGNE, Jakub Debold on: 01/31/2024 07:27 AM   Modules accepted: Orders

## 2024-02-29 DIAGNOSIS — K080 Exfoliation of teeth due to systemic causes: Secondary | ICD-10-CM | POA: Diagnosis not present

## 2024-03-11 ENCOUNTER — Encounter: Payer: Self-pay | Admitting: Family Medicine

## 2024-03-11 ENCOUNTER — Other Ambulatory Visit: Payer: Self-pay

## 2024-03-11 ENCOUNTER — Ambulatory Visit: Payer: Self-pay | Admitting: Family Medicine

## 2024-03-11 ENCOUNTER — Ambulatory Visit: Payer: Medicare Other | Admitting: Family Medicine

## 2024-03-11 VITALS — BP 134/68 | HR 78 | Temp 98.0°F | Resp 16 | Ht 72.0 in | Wt 237.8 lb

## 2024-03-11 DIAGNOSIS — I10 Essential (primary) hypertension: Secondary | ICD-10-CM

## 2024-03-11 DIAGNOSIS — Z0000 Encounter for general adult medical examination without abnormal findings: Secondary | ICD-10-CM

## 2024-03-11 DIAGNOSIS — R739 Hyperglycemia, unspecified: Secondary | ICD-10-CM

## 2024-03-11 DIAGNOSIS — Z125 Encounter for screening for malignant neoplasm of prostate: Secondary | ICD-10-CM

## 2024-03-11 LAB — COMPREHENSIVE METABOLIC PANEL WITH GFR
ALT: 20 U/L (ref 3–53)
AST: 16 U/L (ref 5–37)
Albumin: 4.5 g/dL (ref 3.5–5.2)
Alkaline Phosphatase: 69 U/L (ref 39–117)
BUN: 15 mg/dL (ref 6–23)
CO2: 31 meq/L (ref 19–32)
Calcium: 9.2 mg/dL (ref 8.4–10.5)
Chloride: 100 meq/L (ref 96–112)
Creatinine, Ser: 0.94 mg/dL (ref 0.40–1.50)
GFR: 80.36 mL/min
Glucose, Bld: 108 mg/dL — ABNORMAL HIGH (ref 70–99)
Potassium: 4.3 meq/L (ref 3.5–5.1)
Sodium: 141 meq/L (ref 135–145)
Total Bilirubin: 0.7 mg/dL (ref 0.2–1.2)
Total Protein: 6.8 g/dL (ref 6.0–8.3)

## 2024-03-11 LAB — CBC
HCT: 43.6 % (ref 39.0–52.0)
Hemoglobin: 14.6 g/dL (ref 13.0–17.0)
MCHC: 33.4 g/dL (ref 30.0–36.0)
MCV: 90.9 fl (ref 78.0–100.0)
Platelets: 289 K/uL (ref 150.0–400.0)
RBC: 4.8 Mil/uL (ref 4.22–5.81)
RDW: 13.5 % (ref 11.5–15.5)
WBC: 6.6 K/uL (ref 4.0–10.5)

## 2024-03-11 LAB — LIPID PANEL
Cholesterol: 153 mg/dL (ref 28–200)
HDL: 46.3 mg/dL
LDL Cholesterol: 70 mg/dL (ref 10–99)
NonHDL: 106.7
Total CHOL/HDL Ratio: 3
Triglycerides: 183 mg/dL — ABNORMAL HIGH (ref 10.0–149.0)
VLDL: 36.6 mg/dL (ref 0.0–40.0)

## 2024-03-11 LAB — PSA, MEDICARE: PSA: 0.92 ng/mL (ref 0.10–4.00)
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
## 2024-03-11 NOTE — Patient Instructions (Signed)
 Give us  2-3 business days to get the results of your labs back.   Keep the diet clean and stay active.  Let us  know if you need anything.

## 2024-03-12 ENCOUNTER — Ambulatory Visit (HOSPITAL_COMMUNITY)
Admission: RE | Admit: 2024-03-12 | Discharge: 2024-03-12 | Disposition: A | Discharge status: Home / Self Care | Source: Ambulatory Visit | Attending: Cardiovascular Disease | Admitting: Cardiovascular Disease

## 2024-03-12 ENCOUNTER — Ambulatory Visit: Payer: Self-pay | Admitting: Family Medicine

## 2024-03-12 ENCOUNTER — Ambulatory Visit (INDEPENDENT_AMBULATORY_CARE_PROVIDER_SITE_OTHER)

## 2024-03-12 DIAGNOSIS — I517 Cardiomegaly: Secondary | ICD-10-CM | POA: Diagnosis present

## 2024-03-12 DIAGNOSIS — R739 Hyperglycemia, unspecified: Secondary | ICD-10-CM | POA: Diagnosis not present

## 2024-03-12 DIAGNOSIS — I77810 Thoracic aortic ectasia: Secondary | ICD-10-CM | POA: Diagnosis not present

## 2024-03-12 LAB — HEMOGLOBIN A1C: Hgb A1c MFr Bld: 5.6 % (ref 4.6–6.5)

## 2024-03-13 LAB — ECHOCARDIOGRAM COMPLETE
Area-P 1/2: 2.77 cm2
P 1/2 time: 559 ms
S' Lateral: 3.7 cm

## 2024-03-17 ENCOUNTER — Ambulatory Visit: Payer: Self-pay | Admitting: Cardiology

## 2024-03-17 DIAGNOSIS — I77810 Thoracic aortic ectasia: Secondary | ICD-10-CM

## 2024-04-02 NOTE — Progress Notes (Signed)
 [name]                                          MRN: [id number]   04/02/2024   The [facility] Quality Team Specialist reviewed this patient medical record for the purposes of chart review for care gap closure. The following were reviewed: abstraction for care gap closure-controlling blood pressure.    [facility] Quality Team

## 2024-04-22 ENCOUNTER — Other Ambulatory Visit

## 2024-05-30 ENCOUNTER — Ambulatory Visit
